# Patient Record
Sex: Male | Born: 1975 | Race: White | Hispanic: No | Marital: Married | Smoking: Current every day smoker
Health system: Southern US, Community
[De-identification: ages and names within clinical notes are randomized; demographics above are authoritative.]

## PROBLEM LIST (undated history)

## (undated) DIAGNOSIS — K449 Diaphragmatic hernia without obstruction or gangrene: Secondary | ICD-10-CM

## (undated) DIAGNOSIS — K2270 Barrett's esophagus without dysplasia: Secondary | ICD-10-CM

## (undated) DIAGNOSIS — Z8619 Personal history of other infectious and parasitic diseases: Secondary | ICD-10-CM

## (undated) DIAGNOSIS — K649 Unspecified hemorrhoids: Secondary | ICD-10-CM

## (undated) DIAGNOSIS — K219 Gastro-esophageal reflux disease without esophagitis: Secondary | ICD-10-CM

## (undated) DIAGNOSIS — M779 Enthesopathy, unspecified: Secondary | ICD-10-CM

## (undated) DIAGNOSIS — M7030 Other bursitis of elbow, unspecified elbow: Secondary | ICD-10-CM

## (undated) HISTORY — PX: HAND SURGERY: SHX662

## (undated) HISTORY — DX: Personal history of other infectious and parasitic diseases: Z86.19

## (undated) HISTORY — PX: HEMORRHOID SURGERY: SHX153

## (undated) HISTORY — DX: Unspecified hemorrhoids: K64.9

## (undated) HISTORY — PX: OTHER SURGICAL HISTORY: SHX169

## (undated) HISTORY — DX: Barrett's esophagus without dysplasia: K22.70

## (undated) HISTORY — DX: Diaphragmatic hernia without obstruction or gangrene: K44.9

## (undated) HISTORY — PX: SHOULDER SURGERY: SHX246

---

## 1999-09-22 ENCOUNTER — Emergency Department (HOSPITAL_COMMUNITY): Admission: EM | Admit: 1999-09-22 | Discharge: 1999-09-22 | Payer: Self-pay | Admitting: Emergency Medicine

## 2000-01-09 ENCOUNTER — Emergency Department (HOSPITAL_COMMUNITY): Admission: EM | Admit: 2000-01-09 | Discharge: 2000-01-09 | Payer: Self-pay | Admitting: Emergency Medicine

## 2003-02-13 ENCOUNTER — Emergency Department (HOSPITAL_COMMUNITY): Admission: EM | Admit: 2003-02-13 | Discharge: 2003-02-13 | Payer: Self-pay | Admitting: Emergency Medicine

## 2003-02-13 ENCOUNTER — Encounter: Payer: Self-pay | Admitting: Emergency Medicine

## 2007-06-28 ENCOUNTER — Emergency Department (HOSPITAL_COMMUNITY): Admission: EM | Admit: 2007-06-28 | Discharge: 2007-06-28 | Payer: Self-pay | Admitting: Emergency Medicine

## 2007-07-01 ENCOUNTER — Emergency Department (HOSPITAL_COMMUNITY): Admission: EM | Admit: 2007-07-01 | Discharge: 2007-07-01 | Payer: Self-pay | Admitting: Emergency Medicine

## 2007-07-02 ENCOUNTER — Ambulatory Visit: Payer: Self-pay | Admitting: Internal Medicine

## 2007-07-09 ENCOUNTER — Ambulatory Visit (HOSPITAL_COMMUNITY): Admission: RE | Admit: 2007-07-09 | Discharge: 2007-07-09 | Payer: Self-pay | Admitting: Internal Medicine

## 2007-07-09 ENCOUNTER — Ambulatory Visit: Payer: Self-pay | Admitting: Internal Medicine

## 2007-07-09 HISTORY — PX: COLONOSCOPY: SHX174

## 2007-12-27 ENCOUNTER — Ambulatory Visit (HOSPITAL_COMMUNITY): Admission: RE | Admit: 2007-12-27 | Discharge: 2007-12-27 | Payer: Self-pay | Admitting: Family Medicine

## 2008-01-11 ENCOUNTER — Ambulatory Visit (HOSPITAL_COMMUNITY): Admission: RE | Admit: 2008-01-11 | Discharge: 2008-01-11 | Payer: Self-pay | Admitting: Family Medicine

## 2008-02-07 ENCOUNTER — Ambulatory Visit (HOSPITAL_COMMUNITY): Admission: RE | Admit: 2008-02-07 | Discharge: 2008-02-07 | Payer: Self-pay | Admitting: Orthopaedic Surgery

## 2008-07-16 ENCOUNTER — Emergency Department (HOSPITAL_COMMUNITY): Admission: EM | Admit: 2008-07-16 | Discharge: 2008-07-16 | Payer: Self-pay | Admitting: Emergency Medicine

## 2008-11-07 ENCOUNTER — Ambulatory Visit (HOSPITAL_COMMUNITY): Admission: RE | Admit: 2008-11-07 | Discharge: 2008-11-07 | Payer: Self-pay | Admitting: Orthopaedic Surgery

## 2009-06-03 ENCOUNTER — Ambulatory Visit (HOSPITAL_COMMUNITY): Admission: RE | Admit: 2009-06-03 | Discharge: 2009-06-03 | Payer: Self-pay | Admitting: Orthopaedic Surgery

## 2009-12-23 ENCOUNTER — Emergency Department (HOSPITAL_COMMUNITY): Admission: EM | Admit: 2009-12-23 | Discharge: 2009-12-23 | Payer: Self-pay | Admitting: Emergency Medicine

## 2010-04-28 ENCOUNTER — Ambulatory Visit (HOSPITAL_COMMUNITY): Admission: RE | Admit: 2010-04-28 | Discharge: 2010-04-28 | Payer: Self-pay | Admitting: Orthopaedic Surgery

## 2010-07-26 ENCOUNTER — Emergency Department (HOSPITAL_COMMUNITY): Admission: EM | Admit: 2010-07-26 | Discharge: 2010-07-26 | Payer: Self-pay | Admitting: Emergency Medicine

## 2010-08-05 ENCOUNTER — Emergency Department (HOSPITAL_COMMUNITY): Admission: EM | Admit: 2010-08-05 | Discharge: 2010-08-05 | Payer: Self-pay | Admitting: Emergency Medicine

## 2010-10-31 ENCOUNTER — Encounter: Payer: Self-pay | Admitting: Family Medicine

## 2010-12-21 ENCOUNTER — Ambulatory Visit (INDEPENDENT_AMBULATORY_CARE_PROVIDER_SITE_OTHER): Payer: Federal, State, Local not specified - PPO | Admitting: Gastroenterology

## 2010-12-21 ENCOUNTER — Encounter: Payer: Self-pay | Admitting: Gastroenterology

## 2010-12-21 ENCOUNTER — Encounter: Payer: Self-pay | Admitting: Internal Medicine

## 2010-12-21 DIAGNOSIS — R1013 Epigastric pain: Secondary | ICD-10-CM

## 2010-12-21 DIAGNOSIS — K920 Hematemesis: Secondary | ICD-10-CM

## 2010-12-22 LAB — DIFFERENTIAL
Basophils Relative: 0 % (ref 0–1)
Eosinophils Absolute: 0.2 10*3/uL (ref 0.0–0.7)
Lymphocytes Relative: 21 % (ref 12–46)
Lymphs Abs: 2.6 10*3/uL (ref 0.7–4.0)
Neutrophils Relative %: 71 % (ref 43–77)

## 2010-12-22 LAB — CBC
Platelets: 263 10*3/uL (ref 150–400)
RBC: 5.16 MIL/uL (ref 4.22–5.81)
RDW: 12.6 % (ref 11.5–15.5)
WBC: 12.2 10*3/uL — ABNORMAL HIGH (ref 4.0–10.5)

## 2010-12-22 LAB — BASIC METABOLIC PANEL
BUN: 11 mg/dL (ref 6–23)
Calcium: 9.4 mg/dL (ref 8.4–10.5)
Creatinine, Ser: 1 mg/dL (ref 0.4–1.5)
GFR calc Af Amer: >60 mL/min (ref >60)
GFR calc non Af Amer: >60 mL/min (ref >60)

## 2010-12-23 ENCOUNTER — Other Ambulatory Visit: Payer: Self-pay | Admitting: Internal Medicine

## 2010-12-23 ENCOUNTER — Encounter: Payer: Federal, State, Local not specified - PPO | Admitting: Internal Medicine

## 2010-12-23 ENCOUNTER — Ambulatory Visit (HOSPITAL_COMMUNITY)
Admission: RE | Admit: 2010-12-23 | Discharge: 2010-12-23 | Disposition: A | Discharge status: Home / Self Care | Payer: Federal, State, Local not specified - PPO | Source: Ambulatory Visit | Attending: Internal Medicine | Admitting: Internal Medicine

## 2010-12-23 DIAGNOSIS — K2270 Barrett's esophagus without dysplasia: Secondary | ICD-10-CM

## 2010-12-23 DIAGNOSIS — K920 Hematemesis: Secondary | ICD-10-CM

## 2010-12-23 DIAGNOSIS — R112 Nausea with vomiting, unspecified: Secondary | ICD-10-CM

## 2010-12-23 DIAGNOSIS — R1013 Epigastric pain: Secondary | ICD-10-CM | POA: Insufficient documentation

## 2010-12-23 HISTORY — PX: ESOPHAGOGASTRODUODENOSCOPY: SHX1529

## 2010-12-24 LAB — CONVERTED CEMR LAB
ALT: 19 units/L (ref 0–53)
Albumin: 4.8 g/dL (ref 3.5–5.2)
Basophils Relative: 0 % (ref 0–1)
Bilirubin, Direct: 0.1 mg/dL (ref 0.0–0.3)
Eosinophils Absolute: 0.2 10*3/uL (ref 0.0–0.7)
Lipase: 19 units/L (ref 0–75)
MCHC: 33.4 g/dL (ref 30.0–36.0)
MCV: 93.1 fL (ref 78.0–100.0)
Monocytes Relative: 6 % (ref 3–12)
Neutrophils Relative %: 52 % (ref 43–77)
RBC: 5.18 M/uL (ref 4.22–5.81)
Total Protein: 7 g/dL (ref 6.0–8.3)

## 2010-12-26 ENCOUNTER — Encounter: Payer: Self-pay | Admitting: Internal Medicine

## 2010-12-26 NOTE — Op Note (Signed)
  NAME:  [name], [name]                 ACCOUNT NO.:  [id number]  MEDICAL RECORD NO.:  [id number]           PATIENT TYPE:  O  LOCATION:  DAYP                          FACILITY:  [facility]  PHYSICIAN:  R. Roetta Sessions, M.D. DATE OF BIRTH:  1976-05-30  DATE OF PROCEDURE:  12/23/2010 DATE OF DISCHARGE:                              OPERATIVE REPORT   PROCEDURE:  Esophagogastroduodenoscopy with biopsy.  SURGEON:  R. Roetta Sessions, MD  INDICATIONS FOR PROCEDURE:  This is a 35 year old gentleman with recent nausea, vomiting, and hematemesis.  Hemoglobin remains normal.  He has been smoking marijuana more recently, smoking to alleviate symptoms of nausea.  No dysphagia or odynophagia.  He feels omeprazole makes the symptoms worse.  Lipase and LFTs along with CBC came back completely normal recently through our office.  EGD is now being done to further evaluate his symptoms.  Risks, benefits, limitations, alternatives, and plan have been discussed.  Questions answered.  Please see documentation medical record.  PROCEDURE NOTE:  O2 saturation, blood pressure, pulse, respirations were monitored throughout the entire procedure.  CONSCIOUS SEDATION:  Versed 5 mg IV and Demerol 100 mg IV in divided doses.  Phenergan 25 mg dilute slow IV push to augment conscious sedation.  Cetacaine spray for topical pharyngeal anesthesia.  INSTRUMENT:  Pentax video chip system.  FINDINGS:  Examination of tubular esophagus revealed a prominent 3-cm tongue of salmon-colored epithelium coming up above the GE junction. There were 2 little skinny "ribbons" of salmon-colored epithelium coming up on the opposite wall.  There was no stricture, esophagitis, nodes, or neoplasia.  EG junction was easily traversed.  Stomach:  Gas cavity was emptied and insufflated well with air.  Thorough examination of the gastric mucosa including retroflexion view of the proximal esophagogastric stomach esophagogastric junction  demonstrated only a tiny hiatal hernia.  Pylorus was patent and easily traversed. Examination of the bulb and second portion revealed no abnormalities. Therapeutic/diagnostic maneuvers were performed.  Biopsies of the salmon- colored epithelium coming up into the distal esophageal biopsy for a histologic study.  The patient tolerated the procedure well.  IMPRESSION:  "Tongues" of salmon-colored epithelium, distal esophagus, suspicious for at least short segment Barret esophagus status post biopsy, otherwise normal-appearing tubular sulcus and very small hiatal hernia, otherwise normal stomach.  RECOMMENDATIONS: 1. Stop illicit drug use, marijuana use.  May actually be     complicating/compounding and even contributing to his symptoms of     nausea. 2. Stop omeprazole, begin Dexalone 60 mg orally daily (the patient is     to go to my office to get a 2 weeks free     samples). 3. Prescription for Zofran ODT tablets 1 before meals t.i.d. p.r.n.     nausea. 4. Further recommendations to follow.     Jonathon Bellows, M.D.     RMR/MEDQ  D:  12/23/2010  T:  12/23/2010  Job:  [id number]  cc:   Laverle Hobby, M.D. [address] [place], [place] [address]  Electronically Signed by Lorrin Goodell M.D. on 12/26/2010 09:13:09 AM

## 2010-12-28 ENCOUNTER — Ambulatory Visit: Payer: Self-pay | Admitting: Gastroenterology

## 2010-12-28 NOTE — Letter (Signed)
Summary: EGD ORDER  EGD ORDER   Imported By: Ave Filter 12/21/2010 11:44:58  _____________________________________________________________________  External Attachment:    Type:   Image     Comment:   External Document

## 2010-12-28 NOTE — Assessment & Plan Note (Signed)
Summary: pud,nausea,vomiting   Vital Signs:  Patient profile:   35 year old male Height:      68 inches Weight:      182 pounds BMI:     27.77 Temp:     98.9 degrees F oral Pulse rate:   64 / minute BP sitting:   110 / 82  (left arm)  Vitals Entered By: Carolan Clines LPN (December 21, 2010 10:24 AM)  Visit Type:  consult Referring Provider:  Laverle Hobby  Chief Complaint:  n/v and hematemesis.  History of Present Illness: [name] [name] is a pleasant 35 y/o WM, who was sent by Dr. Wende Crease for further evaluation of n/v, hematemesis. Patient states he woke up last Thursday with N/V. Saw hematemesis with first episode of vomiting. Several months of nausea. Hgb normal. No melena, brbpr, constipation, diarrhea, LUQ pain. LUQ pain worse with meals. Omeprazole started last week but n/v no better. Four days per week drinks 24oz beer. No heartburn. Lost five pounds. No NSAIDS/ASA. Admits to marijuana use every other day. Denies urge to take hot showers for relief of n/v.   Current Medications (verified): 1)  Omeprazole 40 Mg Cpdr (Omeprazole) .... Take 30-58mins Before Meal  Allergies (verified): No Known Drug Allergies  Past History:  Past Medical History: He has a history of esophagitis/GERD with the last EGD by [facility] in [place] in remote past.  TCS/TI, 9/08, Dr. Audelia Hives anal canal, occult fissure not excluded.  Otherwise, normal rectum, colon, and terminal ileum.   Past Surgical History: Shoulder surgery, 1999 Hemorrhoid surgery.  Family History: No FH of liver disease, CRC. Father with "lots of problems".  Social History: Married for 6 years.  He has an 20 year-old daughter who is healthy. He has 2 stepchildren.  He is a Nutritional therapist.  He has a 24-pack-year history of tobacco use.  1/2 pack per day. He consumes four 24oz beers per week.  Smokes marijuana every other day.  Review of Systems General:  Denies fever, chills, sweats, anorexia, fatigue, weakness, and weight  loss. Eyes:  Denies vision loss. ENT:  Denies nasal congestion, hoarseness, and difficulty swallowing. CV:  Denies chest pains, angina, palpitations, dyspnea on exertion, and peripheral edema. Resp:  Denies dyspnea at rest, dyspnea with exercise, cough, sputum, and wheezing. GI:  See HPI. GU:  Denies urinary burning and blood in urine. MS:  Denies joint pain / LOM and low back pain. Derm:  Denies rash and itching. Neuro:  Denies weakness, frequent headaches, memory loss, and confusion. Psych:  Denies depression and anxiety. Endo:  Denies unusual weight change. Heme:  Denies bruising and bleeding. Allergy:  Denies hives and rash.  Physical Exam  General:  Well developed, well nourished, no acute distress. Head:  Normocephalic and atraumatic. Eyes:  Conjunctivae pink, no scleral icterus.  Mouth:  Oropharyngeal mucosa moist, pink.  No lesions, erythema or exudate.    Neck:  Supple; no masses or thyromegaly. Lungs:  Clear throughout to auscultation. Heart:  Regular rate and rhythm; no murmurs, rubs,  or bruits. Abdomen:  Moderate epig tenderness. No rebound or guarding. No HSM or masses, no abd bruit or hernia. Extremities:  No clubbing, cyanosis, edema or deformities noted. Neurologic:  Alert and  oriented x4;  grossly normal neurologically. Skin:  Intact without significant lesions or rashes. Cervical Nodes:  No significant cervical adenopathy. Psych:  Alert and cooperative. Normal mood and affect.   Impression & Recommendations:  Problem # 1:  EPIGASTRIC PAIN (ICD-789.06) Epigastric pain  associated with chronic nausea, recent vomiting with hematemesis. Cannot r/o cyclical vomiting related to marijuana use. Given hematemesis will schedule EGD. EGD to be performed in near future.  Risks, alternatives, benefits including but not limited to risk of reaction to medications, bleeding, infection, and perforation addressed.  Patient voiced understanding and verbal consent obtained. Check  labs. R/O pancreatitis as well. Recommended cessation of marijuana use. Start Nexium 40mg  by mouth daily, samples #10 and RX provided. Zofran RX.  Orders: T-CBC w/Diff [id number]) T-Lipase [phone]) T-Hepatic Function [phone]) Consultation Level III (62952) Prescriptions: ZOFRAN 4 MG TABS (ONDANSETRON HCL) one by mouth every 4-6 hours as needed n/v  #20 x 0   Entered and Authorized by:   Leanna Battles. Dixon Boos   Signed by:   Leanna Battles Dixon Boos on 12/21/2010   Method used:   Electronically to        [facility] (retail)       [address]       [place], [place]  [address]       Ph: [phone]       Fax: [phone]   RxID:   [id number] NEXIUM 40 MG CPDR (ESOMEPRAZOLE MAGNESIUM) one by mouth 30 mins before breakfast daily  #30 x 11   Entered and Authorized by:   Leanna Battles. Dixon Boos   Signed by:   Leanna Battles Dixon Boos on 12/21/2010   Method used:   Electronically to        [facility] (retail)       [address]       [place], [place]  [address]       Ph: [phone]       Fax: [phone]   RxID:   [id number]    Orders Added: 1)  T-CBC w/Diff [[id number]] 2)  T-Lipase [phone] 3)  T-Hepatic Function [[id number]] 4)  Consultation Level III [81829]   I would like to thank Dr. Wende Crease for allowing [place] to take part in the care of this nice patient.

## 2011-01-06 NOTE — Letter (Signed)
Summary: Patient Notice, Endo Biopsy Results  [facility]  [address]   [place], [place] [address]   Phone: [phone]  Fax: [phone]       December 26, 2010   [name] [name] [address] [place], [place]  [address] 06-Aug-1976    Dear [name] [name],  I am pleased to inform you that the biopsies taken during your recent endoscopic examination did not show any evidence of cancer upon pathologic examination.  You do have a condition called Barrett's esophagus which is related to long-standing acid reflux.  Additional information/recommendations:  Continue with the treatment plan as outlined on the day of your exam.  You should have a repeat endoscopic examination in 1 year.  Please call us if you are having persistent problems or have questions about your condition that have not been fully answered at this time.  Sincerely,    R. Roetta Sessions MD, Caleen Essex  [facility] Ph: [phone]   Fax: [phone]   Appended Document: Patient Notice, Endo Biopsy Results letter mailed to pt  Appended Document: Patient Notice, Endo Biopsy Results CC TO PCP  Appended Document: Patient Notice, Endo Biopsy Results reminder in epic

## 2011-02-22 NOTE — Consult Note (Signed)
NAME:  [name] [name], [name]                 ACCOUNT NO.:  [id number]   MEDICAL RECORD NO.:  [id number]          PATIENT TYPE:  AMB   LOCATION:  DAY                           FACILITY:  [facility]   PHYSICIAN:  R. Roetta Sessions, M.D. DATE OF BIRTH:  02-25-1976   DATE OF CONSULTATION:  DATE OF DISCHARGE:                                 CONSULTATION   REFERRING PHYSICIAN:  Dr. Rosalia [name] [name].   REASON FOR CONSULTATION:  Rectal bleeding.   HISTORY OF PRESENT ILLNESS:  [name] [name] [name] is a 35 year old, Caucasian male  who presented to the emergency room on June 28, 2007. Around 9:00  a.m., he developed a large amount of burgundy blood with a small amount  of stooling. He had a second episode around 9:30. He denies any clots.  He denies any history of diarrhea or constipation. Denied any abdominal  pain. He did have some proctalgia but denies any pruritus.  He denies  any NSAID or aspirin use.  He denies any heartburn or indigestion and is  maintained on Prilosec 20 mg daily.  Denies any anorexia or early  satiety.  He has lost about 20 pounds in the last couple of months but  his wife states he has increased his physical activity through work  recently.   [name] [name] [name] had a CBC on June 28, 2007 when he was seen in emergency  room and he was shown to have a hemoglobin of 16.1, hematocrit 45.7, and  platelet count of 252, white blood cell count of 8.1.  He had a CT of  the abdomen and pelvis the same date which was normal with contrast.  He  had a normal CMP and a normal urinalysis.   PAST MEDICAL AND SURGICAL HISTORY:  He had an EGD about 5 years ago in  [place]. He said he was told he had esophagitis/chronic GERD, right  shoulder surgery in 1999.   CURRENT MEDICATIONS:  Prilosec 20 mg daily.   ALLERGIES:  No known drug allergies.   FAMILY HISTORY:  No known family history of colorectal carcinoma, liver  or chronic GI problems.  However, the patient does not know family  medical history very well.  He  does know that his father has diabetes  and hypertension.   SOCIAL HISTORY:  [name] [name] [name] is married.  He has two stepchildren as well  as an 60-month-old son.  He is a Nutritional therapist.  He has 20 pack-year history  of tobacco use.  He drinks about a six-pack of beer per week and denies  any drug use.   REVIEW OF SYSTEMS:  See HPI.  Otherwise negative.   PHYSICAL EXAM:  VITAL SIGNS:  Weight 179 pounds, height 70 inches,  temperature 98 degrees, blood pressure 120/80, and pulse 58.  GENERAL:  [name] [name] [name] is a well-developed, well-nourished, Caucasian male  in no acute distress.  HEENT.  Sclera clear. Nonicteric.  Conjunction pink.  Oropharynx pink  and moist without lesions.  NECK:  Supple without mass or thyromegaly.  CHEST:  Heart regular rate and rhythm. Normal S1, S2 without murmur,  thrills, rubs or  gallops.  LUNGS:  Clear to auscultation bilaterally.  ABDOMEN:  Positive bowel sounds x4.  No bruits auscultated.  Soft,  nontender, nondistended, without palpable mass or hepatosplenomegaly.  No rebound tenderness or guarding.  EXTREMITIES:  Without clubbing or edema bilaterally.  SKIN:  Pink, warm, and dry without any rash or jaundice.  RECTAL:  No external lesions visualized. Good sphincter tone, no  internal masses palpated. A small amount of clear secretions and scant  brown stool was obtained from the vault which was Hemoccult negative.   IMPRESSION:  [name] [name] [name] is a 35 year old Caucasian male with 2 episodes  of moderate to large volume burgundy blood in his stool last week. He is  going to require further evaluation to rule out diverticular bleeding,  polyps or colorectal carcinoma, ischemia should remain in the  differential as well.   PLAN:  Colonoscopy with Dr. Jena Gauss in the near future. I discussed the  procedure including risks and benefits which include but are not limited  to bleeding, infection, perforation, drug reaction, he agrees and  informed consent will be  obtained.      Lorenza Burton, N.P.      Jonathon Bellows, M.D.  Electronically Signed    KJ/MEDQ  D:  07/02/2007  T:  07/03/2007  Job:  [id number]

## 2011-02-22 NOTE — Consult Note (Signed)
NAME:  [name], [name]                 ACCOUNT NO.:  [id number]   MEDICAL RECORD NO.:  [id number]          PATIENT TYPE:  AMB   LOCATION:  DAY                           FACILITY:  [facility]   PHYSICIAN:  R. Roetta Sessions, M.D. DATE OF BIRTH:  Mar 03, 1976   DATE OF CONSULTATION:  DATE OF DISCHARGE:                                 CONSULTATION   REQUESTING PHYSICIAN:  Dr. Rosalia Hammers.   REASON FOR CONSULTATION:  Rectal bleeding.   HISTORY OF PRESENT ILLNESS:  [name] [name] is 35 year old Caucasian male who  on June 28, 2007 developed moderate to large volume burgundy bloody  stools.  He had one about 9:00 a.m. and other about 9:30.  He denies any  clots.  He denies any abdominal pain.  He did have some proctalgia.  Denies any diarrhea or constipation.  Denies any rectal pruritus.  Denies any NSAID or aspirin use.  Denies any heartburn, indigestion,  dysphagia or odynophagia.  Denies any anorexia or early satiety.  He has  lost about 20 pounds in the last couple of months, but his wife  attributes this to increased physical activity at work.  While  hospitalized, he had a normal CBC, normal CMP, normal urinalysis and  normal CT scan of the abdomen and pelvis with IV and oral contrast.   PAST MEDICAL AND SURGICAL HISTORY:  He has a history of esophagitis/GERD  with the last EGD by [facility] in [place] approximately 5 years ago per  patient report.  He has history of right shoulder surgery in 1999.   CURRENT MEDICATIONS:  Prilosec 20 mg daily.   ALLERGIES:  NO KNOWN DRUG ALLERGIES.   FAMILY HISTORY:  There is no known family history of colorectal  carcinoma, liver or chronic GI problems as far as the patient knows.  He  does not have a good assessment of his family history but does note his  father has hypertension and diabetes.   SOCIAL HISTORY:  [name] [name] has been married for 3 years.  He has an 48-  month-old daughter who is healthy.  He has 2 stepchildren.  He is a  Nutritional therapist.  He has a  20-pack-year history of tobacco use.  He consumes  about six beers per week.  Denies any drug use.   REVIEW OF SYSTEMS:  See HPI.  Otherwise negative.   PHYSICAL EXAMINATION:  VITAL SIGNS:  Weight 179 pounds, height 70  inches, temperature 98 degrees, blood pressure 120/80, pulse 58.  GENERAL:  [name] [name] is a well-developed, well-nourished Caucasian male  in no acute distress accompanied by his wife today.  HEENT:  Sclerae clear, nonicteric.  Conjunctivae pink.  Oropharynx is  moist without lesions.  NECK:  Supple without any masses or thyromegaly.  CHEST:  Heart regular rhythm, normal S1 and S2 without murmurs, rubs,  clicks, rubs or gallop.  LUNGS:  Clear to auscultation bilaterally.  ABDOMEN:  Positive bowel sounds x4.  No bruits auscultated.  Soft,  nontender and nondistended without palpable mass or hepatosplenomegaly.  No rebound tenderness or guarding.  RECTAL:  No external lesions visualized.  Good sphincter tone noted.  No  internal masses palpated.  Small amount of clear secretions and scant  brown stool obtained for the vault which was Hemoccult negative.  EXTREMITIES:  Without clubbing or edema bilaterally.   IMPRESSION:  [name] [name] is a 35-year Caucasian male with a self-limited  episode of moderate to large volume rectal bleeding.  He is going to  require further evaluation to rule out diverticular bleeding, ischemia,  polyps, or colorectal carcinoma.   PLAN:  1. Colonoscopy with Dr. Jena Gauss in the near future.  I have discussed      the procedure including risks and benefits which include but are      not limited to bleeding, infection, perforation, drug reaction.  He      agrees with the plan and consent will be obtained.  2. Further recommendations pending procedure.      Lorenza Burton, N.P.      Jonathon Bellows, M.D.  Electronically Signed    KJ/MEDQ  D:  07/02/2007  T:  07/03/2007  Job:  [id number]

## 2011-02-22 NOTE — Op Note (Signed)
NAME:  [name] [name], [name] [name]                 ACCOUNT NO.:  [id number]   MEDICAL RECORD NO.:  [id number]          PATIENT TYPE:  AMB   LOCATION:  DAY                           FACILITY:  [facility]   PHYSICIAN:  J. Darreld Mclean, M.D. DATE OF BIRTH:  Jul 17, 1976   DATE OF PROCEDURE:  DATE OF DISCHARGE:                               OPERATIVE REPORT   PREOPERATIVE DIAGNOSES:  Status post injury to right long extensor  tendon at the metacarpophalangeal joint dorsally with granuloma and  possible tendon injury.   POSTOPERATIVE DIAGNOSES:  Status post injury to right long extensor  tendon at the metacarpophalangeal joint area with granuloma and partial  tear of the extensor tendon.   PROCEDURE:  1. Exploration of old injury to right long finger at the MCP joint      area with excision of granuloma and granulomatous material.  2. Repair of the partial tear of extensor tendon, right long finger.   ANESTHESIA:  Bier block.  Please see anesthesia record for tourniquet  time.   No drains were used.  A dorsal aluminum splint was used.   SURGEON:  J. Darreld Mclean, MD.   SPECIMENS:  Sent to pathology, granulomatous material.   INDICATIONS:  The patient is a 35 year old male who cut his right  dominant hand over the MCP joint area to the right long extensor tendon  in mid February of this year.  He had some pain and tenderness  approximately 3-4 weeks later.  He was seen by his family physician and  given antibiotics.  The area has developed a knot and tenderness and  has bothered him at his work.  Due to his work schedule, he was unable  to have anything done to this surgically until now.  I told him he  probably had a partial tear with granulomatous material.  He understands  he will be in a splint postoperatively for 10 days to 3 weeks depending  on what is found and he will need a pain medicine.  This is an  outpatient procedure.  He appears to understand the risks and  imponderables.  He  understands that due to his type of work if he hits  it or bangs it, then some granulomatous tissue may reappear.   DESCRIPTION OF PROCEDURE:  The patient was seen in the holding area, the  correct surgical site was marked over the right long extensor tendon, he  placed a mark, and he was brought back to the operating room area.  He  was given Bier block anesthesia while supine on the operating room table  with hand table attached.  He was prepped and draped in the usual  manner.  A time-out identifying Mr. Tagliaferri as the patient and the right  long extensor tendon of the MCP joint area as the correct site of  surgery.  Incision was made just proximal to the granulomatous swelling  and brought proximally.  The extensor tendon had some slight changes.  I  extended the wound, and I could see where there had been a partial tear  of the extensor  tendon with considerable amount of granulomatous  material.  Granulomatous material was removed sharply and then on the  skin  where there was a large amount of granulomatous tissue, this was  excised as well including the skin over it.  There was no drainage  tract.  After removing most of the granulomatous tissue, I could see  there was a partial tear of the extensor tendon.  It was a V-shape type  tear.  The tendon was more intact on the radial side and on the little  finger over the lateral side/ulnar side, that is where the tear was.  I  freed up this area, roughed it up so to speak, and then closed this  using 4-0 Prolene suture.  Nice repair was obtained.  Excess  granulomatous tissue was removed.  Passively, I was able to move his  fingers, and they worked well.  The skin wound was closed using 3-0  nylon in a vertical mattress fashion.  Sterile dressing was applied,  bulky dressing applied, Ace bandage applied, and then a dorsal aluminum  splint applied to the long finger.  The patient tolerated the procedure  well, went to recovery in good  condition.  Prescription of Percocet  given for pain.  I will see him in the office in approximately 10 days  to 2 weeks.  If any difficulties, he is to contact me through the office  or hospital beeper system.  Numbers have been provided.           ______________________________  Shela Commons. Darreld Mclean, M.D.     JWK/MEDQ  D:  02/07/2008  T:  02/07/2008  Job:  [id number]

## 2011-02-22 NOTE — Op Note (Signed)
NAME:  [name] [name], [name] [name]                 ACCOUNT NO.:  [id number]   MEDICAL RECORD NO.:  [id number]          PATIENT TYPE:  AMB   LOCATION:  DAY                           FACILITY:  [facility]   PHYSICIAN:  R. Roetta Sessions, M.D. DATE OF BIRTH:  06-01-1976   DATE OF PROCEDURE:  DATE OF DISCHARGE:                               OPERATIVE REPORT   PROCEDURE:  Ileocolonoscopy diagnostic.   INDICATIONS FOR PROCEDURE:  A couple of episodes of painless  hematochezia.  He was seen in the ED on September 18th by Dr. Margarita Grizzle.  At that point in time, he was found to be hemodynamically stable  and had an H&H of 16.1 and 45.7.  He has not had any upper GI tract  symptoms besides reflux well-controlled on Prilosec 20 mg orally daily.  He has not had any melena, no abdominal pain, no family history of GI  malignancy.  Colonoscopy is now being done.  This approach has been  discussed at length with the patient, the potential risks, benefits, and  alternatives have been reviewed, questions answered, he is agreeable.  Please see the documentation in the medical record.   PROCEDURE NOTE:  O2 saturation, blood pressure, pulse, and respirations  were monitored throughout the entire procedure.  Conscious sedation with  Versed 3 mg IV, Demerol 75 mg IV in divided doses.   INSTRUMENT:  Pentax video chip system.   FINDINGS:  Digital rectal exam revealed no abnormalities.   ENDOSCOPIC FINDINGS:  The prep was excellent:  Colon:  The colonic  mucosa was surveyed from the rectosigmoid junction to the left tranverse  and right colon to the appendiceal orifices, ileocecal valve, and the  cecum.  These structures were well-seen and photographed for the record.  The terminal ileum was then invaded 10 ml.  From this level, the scope  was slowly withdrawn.  All previous colonic mucosal surfaces were again  seen.  The colonic mucosa appeared entirely normal as did the terminal  ileal mucosa.  The scope was pulled down  into the rectum __________ the  rectal mucosa and complete retroflexed view of the anal verge.  An on-  phos view of the anal canal demonstrated friable anal canal, no discrete  hemorrhoids, fissure, or other abnormality was observed.  The patient  tolerated the procedure well and was reacted in Endoscopy.   IMPRESSION:  Friable anal canal, occult fissure not excluded.  Otherwise, normal rectum, colon, and terminal ileum.   I suspect the patient has experienced rectal bleeding from a benign  anorectal source.   RECOMMENDATIONS:  1. Cut back on weekly consumption of beer.  2. Anusol-HC suppositories one per rectum at bedtime x10 days.  3. [name] is to let me know if he has any recurrent bleeding.      Jonathon Bellows, M.D.  Electronically Signed     RMR/MEDQ  D:  07/09/2007  T:  07/09/2007  Job:  [id number]   cc:   Hilario Quarry, M.D.  Fax: [phone]

## 2011-02-22 NOTE — H&P (Signed)
NAME:  [name] [name], [name] [name]                 ACCOUNT NO.:  [id number]   MEDICAL RECORD NO.:  [id number]          PATIENT TYPE:  AMB   LOCATION:  DAY                           FACILITY:  [facility]   PHYSICIAN:  J. Darreld Mclean, M.D. DATE OF BIRTH:  1976/07/21   DATE OF ADMISSION:  DATE OF DISCHARGE:  LH                              HISTORY & PHYSICAL   CHIEF COMPLAINT:  I've got a knot on my right hand.   I first saw the patient in the office on March 20.  He is 35 years old.  He had been seen in [place] when he cut his right third metacarpal joint  area on a galvanized nail in mid February of this year.  He developed  swelling and tenderness over the joint.  He has a large prominence  there.  I recommend exploration of wound.  I think he may have a partial  tear of extensor tendon which is healed with exorbitant granulation.  He  was doing a job out of town that he said he could not miss, that he  could not be out of work due to the bad economy and that he had taken  some time to get the job which is paying good money.  He had to work at  least until the end of April before he could take any time at all.  I  told him to just watch the hand. If it got worse, to let me know.  We  scheduled him for exploration of the old wound the end of April. He  agreed to this, and I agree to this.  It should be noted that x-rays  were negative.  He had doxycycline at the time of the injury, and that  resolved any inflammation.  I saw him in the office the day before  surgery, April 29, and the extensor tendon appears to be intact, though  it is slightly weak and has a very large prominence over the  metacarpophalangeal joint dorsally of the extensor tendon here.  It is  tender and swollen.  It is not red.  Neurovascular is intact.   PAST MEDICAL HISTORY:  Negative.  He denies heart disease, lung disease,  kidney disease, stroke, paralysis, weakness, hypertension, diabetes, TB,  cancer.  Does have a history of  ulcer disease and denies circulatory  problems.   He has no allergies.  He takes Prilosec daily.   SOCIAL HISTORY:  He lives in [place], and he is married.  He smokes  and uses alcoholic beverages socially.  Dr. Sherwood Gambler is his family doctor.   He is status post rotator cuff repair in [place] in 1999.   PHYSICAL EXAMINATION:  VITAL SIGNS:  He is afebrile, pulse 60,  respiratory rate 16.  He is 5 feet 7-1/2 inches, 180 pounds.  Blood  pressure is 110/80.  GENERAL:  He is alert, cooperative, oriented.  HEENT:  Negative.  NECK:  Supple.  LUNGS:  Clear to percussion and auscultation.  HEART:  Regular without murmur heard.  ABDOMEN:  Soft, tender without masses.  EXTREMITIES:  On the right hand long finger dorsally, there is  prominence over the metacarpophalangeal joint of the extensor  tenderness.  It is tender.  It is not red.  Motion is good. Grip  strengths are good.  Other extremities are negative.  CNS: Intact.  SKIN:  Intact.   PLAN:  Exploration of old wound, right dorsal hand extensor tendon,  either granulomatous tissue or delayed tendon repair. Discussed with  patient planned procedure, risks, and imponderables.  He appears to  understand and agrees to the procedure as stated.                                            ______________________________  J. Darreld Mclean, M.D.     JWK/MEDQ  D:  02/06/2008  T:  02/06/2008  Job:  [id number]

## 2011-07-05 LAB — URINALYSIS, ROUTINE W REFLEX MICROSCOPIC
Glucose, UA: NEGATIVE
Hgb urine dipstick: NEGATIVE
Specific Gravity, Urine: 1.02
Urobilinogen, UA: 0.2

## 2011-07-05 LAB — BASIC METABOLIC PANEL
Calcium: 9.6
Chloride: 107
Creatinine, Ser: 1.16
GFR calc Af Amer: >60
GFR calc non Af Amer: >60

## 2011-07-05 LAB — DIFFERENTIAL
Lymphs Abs: 2.5
Monocytes Relative: 6
Neutro Abs: 3.1
Neutrophils Relative %: 50

## 2011-07-05 LAB — CBC
RBC: 4.71
WBC: 6.2

## 2011-07-21 LAB — COMPREHENSIVE METABOLIC PANEL
ALT: 22
AST: 17
Albumin: 4.1
Alkaline Phosphatase: 49
BUN: 10
Chloride: 107
Potassium: 4.9
Sodium: 140
Total Bilirubin: 0.9
Total Protein: 6.6

## 2011-07-21 LAB — DIFFERENTIAL
Basophils Absolute: 0
Basophils Relative: 1
Lymphocytes Relative: 45
Neutro Abs: 3.7
Neutrophils Relative %: 45

## 2011-07-21 LAB — URINALYSIS, ROUTINE W REFLEX MICROSCOPIC
Glucose, UA: NEGATIVE
Nitrite: NEGATIVE
Protein, ur: NEGATIVE
pH: 6

## 2011-07-21 LAB — CBC
HCT: 45.7
Platelets: 252
RDW: 12.7
WBC: 8.1

## 2012-01-02 ENCOUNTER — Encounter: Payer: Self-pay | Admitting: Gastroenterology

## 2012-01-09 ENCOUNTER — Encounter: Payer: Self-pay | Admitting: Internal Medicine

## 2012-01-10 ENCOUNTER — Ambulatory Visit: Payer: Federal, State, Local not specified - PPO | Admitting: Gastroenterology

## 2012-01-10 ENCOUNTER — Telehealth: Payer: Self-pay | Admitting: Gastroenterology

## 2012-01-10 NOTE — Telephone Encounter (Signed)
Pt was a no show

## 2012-02-09 NOTE — Telephone Encounter (Signed)
First no-show. Needs letter for OV, follow-up

## 2012-02-15 ENCOUNTER — Encounter: Payer: Self-pay | Admitting: Internal Medicine

## 2012-02-15 NOTE — Telephone Encounter (Signed)
Mailed letter to patient to call office to set up OV °

## 2012-04-16 ENCOUNTER — Encounter (HOSPITAL_COMMUNITY): Payer: Self-pay | Admitting: *Deleted

## 2012-04-16 ENCOUNTER — Emergency Department (HOSPITAL_COMMUNITY)
Admission: EM | Admit: 2012-04-16 | Discharge: 2012-04-16 | Disposition: A | Discharge status: Home / Self Care | Payer: Federal, State, Local not specified - PPO | Attending: Emergency Medicine | Admitting: Emergency Medicine

## 2012-04-16 DIAGNOSIS — K0889 Other specified disorders of teeth and supporting structures: Secondary | ICD-10-CM

## 2012-04-16 DIAGNOSIS — K089 Disorder of teeth and supporting structures, unspecified: Secondary | ICD-10-CM | POA: Insufficient documentation

## 2012-04-16 DIAGNOSIS — F17200 Nicotine dependence, unspecified, uncomplicated: Secondary | ICD-10-CM | POA: Insufficient documentation

## 2012-04-16 MED ORDER — PENICILLIN V POTASSIUM 500 MG PO TABS: 500.0000 mg | ORAL_TABLET | Freq: Four times a day (QID) | ORAL | Status: AC

## 2012-04-16 MED ORDER — PENICILLIN V POTASSIUM 250 MG PO TABS
500.0000 mg | ORAL_TABLET | Freq: Once | ORAL | Status: AC
  Administered 2012-04-16: 500 mg via ORAL
  Filled 2012-04-16: qty 2

## 2012-04-16 MED ORDER — HYDROCODONE-ACETAMINOPHEN 5-325 MG PO TABS
1.0000 | ORAL_TABLET | Freq: Once | ORAL | Status: AC
  Administered 2012-04-16: 1 via ORAL
  Filled 2012-04-16: qty 1

## 2012-04-16 MED ORDER — HYDROCODONE-ACETAMINOPHEN 5-325 MG PO TABS: ORAL_TABLET | ORAL | Status: DC

## 2012-04-16 MED ORDER — HYDROCOD POLST-CHLORPHEN POLST 10-8 MG/5ML PO LQCR: 5.0000 mL | Freq: Once | ORAL | Status: DC

## 2012-04-16 MED ORDER — IBUPROFEN 800 MG PO TABS
800.0000 mg | ORAL_TABLET | Freq: Once | ORAL | Status: AC
  Administered 2012-04-16: 800 mg via ORAL
  Filled 2012-04-16: qty 1

## 2012-04-16 NOTE — ED Notes (Signed)
C/o right side toothache ache, facial pain that started Friday, pt states that he is having pain on lower and upper jaw area, states that he does not have many teeth on the right side in the back area but does have one that needs to be filled.

## 2012-04-16 NOTE — ED Provider Notes (Signed)
Medical screening examination/treatment/procedure(s) were performed by non-physician practitioner and as supervising physician I was immediately available for consultation/collaboration.  Donnetta Hutching, MD 04/16/12 [phone]

## 2012-04-16 NOTE — ED Provider Notes (Signed)
History     CSN: [id number]  Arrival date & time 04/16/12  1421   First MD Initiated Contact with Patient 04/16/12 1453      Chief Complaint  Patient presents with  . Dental Pain    (Consider location/radiation/quality/duration/timing/severity/associated sxs/prior treatment) HPI Comments: Called his dentist and they can see him in 2 days.  Told him to come to the ED.  No fever or chills.  Patient is a 36 y.o. male presenting with tooth pain. The history is provided by the patient. No language interpreter was used.  Dental PainThe primary symptoms include mouth pain. Primary symptoms do not include dental injury or fever. Episode onset: 3 days ago. The symptoms are unchanged. The symptoms occur constantly.  Additional symptoms include: jaw pain. Additional symptoms do not include: gum swelling, purulent gums, facial swelling, ear pain and swollen glands.    History reviewed. No pertinent past medical history.  Past Surgical History  Procedure Date  . Esophagogastroduodenoscopy 12/23/2010    Tongues" of salmon-colored epithelium, distal esophagus,  suspicious for at least short segment Barret esophagus status post biopsy, otherwise normal-appearing tubular sulcus and very small hiatal  hernia, otherwise normal stomach.  . Colonoscopy   07/09/2007    Friable anal canal, occult fissure not excluded  Otherwise, normal rectum, colon, and terminal ileum  . Shoulder surgery   . Hand surgery   . Hemorrhoid surgery     No family history on file.  History  Substance Use Topics  . Smoking status: Current Everyday Smoker  . Smokeless tobacco: Not on file  . Alcohol Use: Yes     occassional       Review of Systems  Constitutional: Negative for fever and chills.  HENT: Positive for dental problem. Negative for ear pain and facial swelling.   All other systems reviewed and are negative.    Allergies  Review of patient's allergies indicates no known allergies.  Home Medications    Current Outpatient Rx  Name Route Sig Dispense Refill  . HYDROCODONE-ACETAMINOPHEN 5-325 MG PO TABS  One tab po q 4-6 hrs prn pain 20 tablet 0  . PENICILLIN V POTASSIUM 500 MG PO TABS Oral Take 1 tablet (500 mg total) by mouth 4 (four) times daily. 40 tablet 0    BP 132/89  Pulse 79  Temp 98.4 F (36.9 C)  Resp 20  Ht 5\' 7"  (1.702 m)  Wt 183 lb (83.008 kg)  BMI 28.66 kg/m2  SpO2 99%  Physical Exam  Nursing note and vitals reviewed. Constitutional: He is oriented to person, place, and time. He appears well-developed and well-nourished.  HENT:  Head: Normocephalic and atraumatic.  Mouth/Throat: Uvula is midline, oropharynx is clear and moist and mucous membranes are normal. Uvula swelling and dental caries present. No dental abscesses.    Eyes: EOM are normal.  Neck: Normal range of motion.  Cardiovascular: Normal rate, regular rhythm, normal heart sounds and intact distal pulses.   Pulmonary/Chest: Effort normal and breath sounds normal. No respiratory distress.  Abdominal: Soft. He exhibits no distension. There is no tenderness.  Musculoskeletal: Normal range of motion.  Neurological: He is alert and oriented to person, place, and time.  Skin: Skin is warm and dry.  Psychiatric: He has a normal mood and affect. Judgment normal.    ED Course  Procedures (including critical care time)  Labs Reviewed - No data to display No results found.   1. Pain, dental       MDM  No obvious abscess rx-hydrocodone, 20 rx-pen VK 500 mg , 40 OTC ibuprofen        Evalina Field, [place] 04/16/12 1526

## 2012-10-06 ENCOUNTER — Emergency Department (HOSPITAL_COMMUNITY)
Admission: EM | Admit: 2012-10-06 | Discharge: 2012-10-06 | Disposition: A | Discharge status: Home / Self Care | Payer: Federal, State, Local not specified - PPO | Attending: Emergency Medicine | Admitting: Emergency Medicine

## 2012-10-06 ENCOUNTER — Encounter (HOSPITAL_COMMUNITY): Payer: Self-pay

## 2012-10-06 DIAGNOSIS — J029 Acute pharyngitis, unspecified: Secondary | ICD-10-CM | POA: Insufficient documentation

## 2012-10-06 DIAGNOSIS — M7710 Lateral epicondylitis, unspecified elbow: Secondary | ICD-10-CM | POA: Insufficient documentation

## 2012-10-06 DIAGNOSIS — R0989 Other specified symptoms and signs involving the circulatory and respiratory systems: Secondary | ICD-10-CM | POA: Insufficient documentation

## 2012-10-06 DIAGNOSIS — R509 Fever, unspecified: Secondary | ICD-10-CM | POA: Insufficient documentation

## 2012-10-06 DIAGNOSIS — F17200 Nicotine dependence, unspecified, uncomplicated: Secondary | ICD-10-CM | POA: Insufficient documentation

## 2012-10-06 MED ORDER — IBUPROFEN 800 MG PO TABS: 800.0000 mg | ORAL_TABLET | Freq: Three times a day (TID) | ORAL | Status: DC

## 2012-10-06 MED ORDER — MAGIC MOUTHWASH W/LIDOCAINE: ORAL | Status: DC

## 2012-10-06 MED ORDER — HYDROCODONE-ACETAMINOPHEN 7.5-500 MG/15ML PO SOLN: ORAL | Status: DC

## 2012-10-06 NOTE — ED Provider Notes (Signed)
History     CSN: [id number]  Arrival date & time 10/06/12  1549   First MD Initiated Contact with Patient 10/06/12 1733      Chief Complaint  Patient presents with  . Sore Throat    (Consider location/radiation/quality/duration/timing/severity/associated sxs/prior treatment) HPI Comments: Patient complains of sore throat for 2 days. States his wife has recently been diagnosed with strep throat and is currently taking antibiotics. He also reports intermittent fever and chills at home.  He states he has been using over-the-counter throat spray without relief. He denies neck pain, headache, vomiting or abdominal pain. Patient also complains of pain to his right elbow for several days. States pain is worse with movement of his arm and with gripping. He states that he drops things when he tries to lift objects with is right arm.  States he has been diagnosed previously with bursitis, but states this pain feels different.  He denies known injury, numbness or tingling to his fingers or swelling of the arm.  Patient is a 36 y.o. male presenting with pharyngitis. The history is provided by the patient.  Sore Throat This is a new problem. The current episode started in the past 7 days. The problem occurs constantly. The problem has been unchanged. Associated symptoms include arthralgias, chills, congestion, a fever and a sore throat. Pertinent negatives include no abdominal pain, chest pain, coughing, headaches, joint swelling, myalgias, nausea, neck pain, numbness, rash, swollen glands, vomiting or weakness. The symptoms are aggravated by swallowing. He has tried nothing for the symptoms. The treatment provided no relief.    History reviewed. No pertinent past medical history.  Past Surgical History  Procedure Date  . Thumb surgery   . Shoulder surgery     No family history on file.  History  Substance Use Topics  . Smoking status: Current Every Day Smoker    Types: Cigarettes  .  Smokeless tobacco: Not on file  . Alcohol Use: No      Review of Systems  Constitutional: Positive for fever and chills. Negative for activity change and appetite change.  HENT: Positive for congestion and sore throat. Negative for ear pain, facial swelling, trouble swallowing, neck pain, neck stiffness and voice change.   Eyes: Negative for pain and visual disturbance.  Respiratory: Negative for cough, chest tightness and shortness of breath.   Cardiovascular: Negative for chest pain.  Gastrointestinal: Negative for nausea, vomiting and abdominal pain.  Genitourinary: Negative for dysuria, flank pain and difficulty urinating.  Musculoskeletal: Positive for arthralgias. Negative for myalgias and joint swelling.  Skin: Negative for color change, rash and wound.  Neurological: Negative for dizziness, facial asymmetry, speech difficulty, weakness, numbness and headaches.  Hematological: Negative for adenopathy.  All other systems reviewed and are negative.    Allergies  Review of patient's allergies indicates no known allergies.  Home Medications  No current outpatient prescriptions on file.  BP 140/90  Pulse 80  Temp 99.4 F (37.4 C) (Oral)  Resp 18  Ht 5\' 9"  (1.753 m)  Wt 187 lb (84.823 kg)  BMI 27.62 kg/m2  SpO2 99%  Physical Exam  Nursing note and vitals reviewed. Constitutional: He is oriented to person, place, and time. He appears well-developed and well-nourished. No distress.  HENT:  Head: Normocephalic and atraumatic. No trismus in the jaw.  Right Ear: Tympanic membrane and ear canal normal.  Left Ear: Tympanic membrane and ear canal normal.  Mouth/Throat: Uvula is midline and mucous membranes are normal. No uvula swelling. Posterior  oropharyngeal edema and posterior oropharyngeal erythema present. No oropharyngeal exudate or tonsillar abscesses.  Neck: Normal range of motion and phonation normal. Neck supple. No Brudzinski's sign and no Kernig's sign noted.    Cardiovascular: Normal rate, regular rhythm, normal heart sounds and intact distal pulses.   No murmur heard. Pulmonary/Chest: Effort normal and breath sounds normal.  Musculoskeletal: Normal range of motion. He exhibits tenderness. He exhibits no edema.       Right elbow: He exhibits normal range of motion, no swelling, no effusion and no laceration. tenderness found. Lateral epicondyle tenderness noted.       Arms:      Localized tenderness to palpation along the lateral upper condyle and right forearm muscles. Radial pulse is brisk, distal sensation intact, cap refill less than 2 seconds. Patient has full range of motion. No edema or erythema of the joint.  Lymphadenopathy:    He has cervical adenopathy.       Right cervical: Superficial cervical adenopathy present.       Left cervical: Superficial cervical adenopathy present.  Neurological: He is alert and oriented to person, place, and time. He exhibits normal muscle tone. Coordination normal.  Skin: Skin is warm and dry.    ED Course  Procedures (including critical care time)  Results for orders placed during the hospital encounter of 10/06/12  RAPID STREP SCREEN      Component Value Range   Streptococcus, Group A Screen (Direct) NEGATIVE  NEGATIVE         MDM     Patient is nontoxic appearing. No focal neuro deficits on exam. Sore throat is likely viral given negative strep.  Tenderness to the right elbow is likely lateral epicondylitis. Doubtful of a septic joint. Patient agrees to followup with Dr. Hilda Lias next week   Prescribed:  lortab elixir Magic mouthwash ibuprofen    Febe Champa L. [place], [place] 10/06/12 1853

## 2012-10-06 NOTE — ED Notes (Signed)
Pt c/o sore throat x2 days and states his wife has strep throat.

## 2012-10-06 NOTE — ED Notes (Signed)
Pt reports sore throat for 2 days, wife dx wife strep throat.

## 2012-10-07 NOTE — ED Provider Notes (Signed)
Medical screening examination/treatment/procedure(s) were performed by non-physician practitioner and as supervising physician I was immediately available for consultation/collaboration.  Kristyanna Barcelo, MD 10/07/12 1515 

## 2012-10-09 ENCOUNTER — Encounter (HOSPITAL_COMMUNITY): Payer: Self-pay | Admitting: *Deleted

## 2013-04-24 ENCOUNTER — Ambulatory Visit (INDEPENDENT_AMBULATORY_CARE_PROVIDER_SITE_OTHER): Payer: Federal, State, Local not specified - PPO | Admitting: Gastroenterology

## 2013-04-24 ENCOUNTER — Encounter: Payer: Self-pay | Admitting: Gastroenterology

## 2013-04-24 VITALS — BP 126/80 | HR 75 | Temp 98.4°F | Ht 68.0 in | Wt 180.8 lb

## 2013-04-24 DIAGNOSIS — K2270 Barrett's esophagus without dysplasia: Secondary | ICD-10-CM

## 2013-04-24 DIAGNOSIS — K625 Hemorrhage of anus and rectum: Secondary | ICD-10-CM

## 2013-04-24 LAB — CBC WITH DIFFERENTIAL/PLATELET
Basophils Relative: 1 % (ref 0–1)
HCT: 43.5 % (ref 39.0–52.0)
Hemoglobin: 15.3 g/dL (ref 13.0–17.0)
Lymphocytes Relative: 48 % — ABNORMAL HIGH (ref 12–46)
Lymphs Abs: 4 10*3/uL (ref 0.7–4.0)
Monocytes Relative: 7 % (ref 3–12)
Neutro Abs: 3.3 10*3/uL (ref 1.7–7.7)
Neutrophils Relative %: 40 % — ABNORMAL LOW (ref 43–77)
RBC: 4.88 MIL/uL (ref 4.22–5.81)

## 2013-04-24 MED ORDER — HYDROCORTISONE ACETATE 25 MG RE SUPP: 25.0000 mg | Freq: Two times a day (BID) | RECTAL | Status: DC

## 2013-04-24 MED ORDER — ESOMEPRAZOLE MAGNESIUM 40 MG PO CPDR: 40.0000 mg | DELAYED_RELEASE_CAPSULE | Freq: Every day | ORAL | Status: DC

## 2013-04-24 MED ORDER — PEG 3350-KCL-NA BICARB-NACL 420 G PO SOLR: 4000.0000 mL | ORAL | Status: DC

## 2013-04-24 NOTE — Progress Notes (Signed)
Referring Provider: No ref. provider found Primary Care Physician:  PICKARD,WARREN TOM, MD Primary GI: Dr. Rourk   Chief Complaint  Patient presents with  . Rectal Bleeding    HPI:   [name] [name] is a pleasant 37-year-old male who presents today as an urgent work-in due to acute onset of large volume hematochezia. He has a history of short-segment Barrett's esophagus, first noted on EGD in March 2012. He has not had surveillance EGD, and he is not on a PPI currently. Denies reflux symptoms. Last colonoscopy in 2008 with friable anal canal, possible occult anal fissure.   Notes rectal bleeding last night and today. Notes a burning sensation in his rectum. Denies constipation/diarrhea. Notes epigastric discomfort since yesterday. Pain not associated with eating/drinking. No N/V. No melena. Denies fatigue, weakness. Denies NSAIDs, but his med list includes Voltaren.   Denies reflux issues. Has taken Prilosec, Nexium in past. Stopped Nexium due to fear of stomach cancer. Marijuana due to nausea.   Past Medical History  Diagnosis Date  . Barrett's esophagus     Past Surgical History  Procedure Laterality Date  . Esophagogastroduodenoscopy  12/23/2010    RMR: short-segment Barrett's  . Colonoscopy    07/09/2007    Friable anal canal, occult fissure not excluded  Otherwise, normal rectum, colon, and terminal ileum  . Hand surgery    . Hemorrhoid surgery    . Thumb surgery    . Shoulder surgery      Current Outpatient Prescriptions  Medication Sig Dispense Refill  . diclofenac (VOLTAREN) 75 MG EC tablet Take 75 mg by mouth 2 (two) times daily as needed (Pain).      . polyethylene glycol-electrolytes (TRILYTE) 420 G solution Take 4,000 mLs by mouth as directed.  4000 mL  0   No current facility-administered medications for this visit.    Allergies as of 04/24/2013  . (No Known Allergies)    Family History  Problem Relation Age of Onset  . Colon cancer Father     History    Social History  . Marital Status: Unknown    Spouse Name: N/A    Number of Children: N/A  . Years of Education: N/A   Social History Main Topics  . Smoking status: Current Every Day Smoker    Types: Cigarettes  . Smokeless tobacco: None  . Alcohol Use: No  . Drug Use: Yes    Special: Marijuana  . Sexually Active: No   Other Topics Concern  . None   Social History Narrative   ** Merged History Encounter **        Review of Systems: Negative unless mentioned in HPI.   Physical Exam: BP 126/80  Pulse 75  Temp(Src) 98.4 F (36.9 C) (Oral)  Ht 5' 8" (1.727 m)  Wt 180 lb 12.8 oz (82.01 kg)  BMI 27.5 kg/m2 General:   Alert and oriented. No distress noted. Pleasant and cooperative. Does not appear acutely ill.  Head:  Normocephalic and atraumatic. Eyes:  Conjuctiva clear without scleral icterus. Mouth:  Oral mucosa pink and moist. Good dentition. No lesions. Neck:  Supple, without mass or thyromegaly. Heart:  S1, S2 present without murmurs, rubs, or gallops. Regular rate and rhythm. Abdomen:  +BS, soft, non-tender and non-distended. No rebound or guarding. No HSM or masses noted. Rectal: PATIENT DECLINED Msk:  Symmetrical without gross deformities. Normal posture. Pulses:  2+ DP noted bilaterally Extremities:  Without edema. Neurologic:  Alert and  oriented x4;  grossly normal neurologically. Skin:    Intact without significant lesions or rashes. Cervical Nodes:  No significant cervical adenopathy. Psych:  Alert and cooperative. Normal mood and affect.  

## 2013-04-24 NOTE — Patient Instructions (Addendum)
Restart Nexium every day, 30 minutes before breakfast.   I have sent in a prescription for suppositories to help calm down any hemorrhoids.   Please have blood work done.  We have set you up for a colonoscopy and upper endoscopy with Dr. Jena Gauss in the near future.

## 2013-04-25 ENCOUNTER — Encounter: Payer: Self-pay | Admitting: Gastroenterology

## 2013-04-25 ENCOUNTER — Encounter (HOSPITAL_COMMUNITY): Payer: Self-pay | Admitting: Pharmacy Technician

## 2013-04-25 ENCOUNTER — Other Ambulatory Visit: Payer: Self-pay | Admitting: General Practice

## 2013-04-25 DIAGNOSIS — K625 Hemorrhage of anus and rectum: Secondary | ICD-10-CM

## 2013-04-25 DIAGNOSIS — K2270 Barrett's esophagus without dysplasia: Secondary | ICD-10-CM | POA: Insufficient documentation

## 2013-04-25 MED ORDER — HYDROCORTISONE ACETATE 25 MG RE SUPP: 25.0000 mg | Freq: Two times a day (BID) | RECTAL | Status: DC

## 2013-04-25 NOTE — Assessment & Plan Note (Addendum)
37 year old pleasant male, presenting today with acute onset of rectal bleeding. Mild rectal discomfort noted. Declined rectal exam. Last colonoscopy in 2008 with friable anal canal and possible occult fissure. He now tells me his father has a history of colon cancer; he is unable to tell me age of onset. With this new knowledge, I recommended repeat lower GI evaluation in the near future and a stat CBC today. He also notes vague epigastric discomfort for approximately 24 hours, without any aggravating factors. He is overdue for Barrett's surveillance, so I have discussed an EGD at time of TCS for surveillance purposes. Highly doubt rapid transit GI bleed; patient is in good spirits and does not appear acutely ill. Likely benign anorectal source.   Proceed with TCS/EGD with Dr. Jena Gauss in near future: the risks, benefits, and alternatives have been discussed with the patient in detail. The patient states understanding and desires to proceed. Phenergan 25 mg IV on call due to marijuana use Resume Nexium daily STAT CBC Start Anusol suppositories BID

## 2013-04-25 NOTE — Assessment & Plan Note (Signed)
EGD at time of TCS for surveillance. Last EGD in March 2012, short-segment Barrett's. Overdue for 1-year-surveillance.

## 2013-04-25 NOTE — Progress Notes (Signed)
Quick Note:  CBC reviewed. Hgb 15.3. WBC 8.2. Attempted to call patient to inform. Proceed with TCS/EGD. ______

## 2013-04-25 NOTE — Progress Notes (Signed)
Cc PCP 

## 2013-04-26 ENCOUNTER — Ambulatory Visit (HOSPITAL_COMMUNITY)
Admission: RE | Admit: 2013-04-26 | Discharge: 2013-04-26 | Disposition: A | Discharge status: Home / Self Care | Payer: Federal, State, Local not specified - PPO | Source: Ambulatory Visit | Attending: Internal Medicine | Admitting: Internal Medicine

## 2013-04-26 ENCOUNTER — Encounter (HOSPITAL_COMMUNITY): Payer: Self-pay | Admitting: *Deleted

## 2013-04-26 ENCOUNTER — Encounter (HOSPITAL_COMMUNITY)
Admission: RE | Disposition: A | Discharge status: Home / Self Care | Payer: Self-pay | Source: Ambulatory Visit | Attending: Internal Medicine

## 2013-04-26 DIAGNOSIS — K648 Other hemorrhoids: Secondary | ICD-10-CM | POA: Insufficient documentation

## 2013-04-26 DIAGNOSIS — K2100 Gastro-esophageal reflux disease with esophagitis, without bleeding: Secondary | ICD-10-CM | POA: Insufficient documentation

## 2013-04-26 DIAGNOSIS — K2270 Barrett's esophagus without dysplasia: Secondary | ICD-10-CM | POA: Insufficient documentation

## 2013-04-26 DIAGNOSIS — K921 Melena: Secondary | ICD-10-CM

## 2013-04-26 DIAGNOSIS — K625 Hemorrhage of anus and rectum: Secondary | ICD-10-CM

## 2013-04-26 HISTORY — PX: COLONOSCOPY WITH ESOPHAGOGASTRODUODENOSCOPY (EGD): SHX5779

## 2013-04-26 HISTORY — DX: Gastro-esophageal reflux disease without esophagitis: K21.9

## 2013-04-26 HISTORY — DX: Other bursitis of elbow, unspecified elbow: M70.30

## 2013-04-26 SURGERY — COLONOSCOPY WITH ESOPHAGOGASTRODUODENOSCOPY (EGD)
Anesthesia: Moderate Sedation

## 2013-04-26 MED ORDER — STERILE WATER FOR IRRIGATION IR SOLN
Status: DC | PRN
  Administered 2013-04-26: 16:00:00

## 2013-04-26 MED ORDER — MIDAZOLAM HCL 5 MG/5ML IJ SOLN
INTRAMUSCULAR | Status: AC
  Filled 2013-04-26: qty 10

## 2013-04-26 MED ORDER — ONDANSETRON HCL 4 MG/2ML IJ SOLN
INTRAMUSCULAR | Status: DC | PRN
  Administered 2013-04-26: 4 mg via INTRAVENOUS

## 2013-04-26 MED ORDER — BUTAMBEN-TETRACAINE-BENZOCAINE 2-2-14 % EX AERO
INHALATION_SPRAY | CUTANEOUS | Status: DC | PRN
  Administered 2013-04-26: 2 via TOPICAL

## 2013-04-26 MED ORDER — MEPERIDINE HCL 100 MG/ML IJ SOLN
INTRAMUSCULAR | Status: AC
  Filled 2013-04-26: qty 2

## 2013-04-26 MED ORDER — PROMETHAZINE HCL 25 MG/ML IJ SOLN
INTRAMUSCULAR | Status: AC
  Filled 2013-04-26: qty 1

## 2013-04-26 MED ORDER — SODIUM CHLORIDE 0.9 % IV SOLN
INTRAVENOUS | Status: DC
  Administered 2013-04-26: 20 mL/h via INTRAVENOUS

## 2013-04-26 MED ORDER — MIDAZOLAM HCL 5 MG/5ML IJ SOLN
INTRAMUSCULAR | Status: DC | PRN
  Administered 2013-04-26: 2 mg via INTRAVENOUS
  Administered 2013-04-26 (×3): 1 mg via INTRAVENOUS

## 2013-04-26 MED ORDER — PROMETHAZINE HCL 25 MG/ML IJ SOLN
25.0000 mg | Freq: Once | INTRAMUSCULAR | Status: AC
  Administered 2013-04-26: 25 mg via INTRAVENOUS

## 2013-04-26 MED ORDER — ONDANSETRON HCL 4 MG/2ML IJ SOLN
INTRAMUSCULAR | Status: AC
  Filled 2013-04-26: qty 2

## 2013-04-26 MED ORDER — MEPERIDINE HCL 100 MG/ML IJ SOLN
INTRAMUSCULAR | Status: DC | PRN
  Administered 2013-04-26: 25 mg via INTRAVENOUS
  Administered 2013-04-26: 50 mg via INTRAVENOUS
  Administered 2013-04-26: 25 mg via INTRAVENOUS

## 2013-04-26 MED ORDER — SODIUM CHLORIDE 0.9 % IJ SOLN
INTRAMUSCULAR | Status: AC
  Filled 2013-04-26: qty 10

## 2013-04-26 NOTE — Op Note (Signed)
[facility] [address] [place], [address]   COLONOSCOPY PROCEDURE REPORT  PATIENT: [name] [name], [name] [name]  MR#:         [id number] BIRTHDATE: [birth year] , 36  yrs. old GENDER: Male ENDOSCOPIST: R.  Roetta Sessions, MD FACP FACG REFERRED BY:  Lynnea Ferrier, M.D. PROCEDURE DATE:  04/26/2013 PROCEDURE:     Colonoscopy-diagnostic  INDICATIONS: 2 episodes of hematochezia; none in several weeks.  INFORMED CONSENT:  The risks, benefits, alternatives and imponderables including but not limited to bleeding, perforation as well as the possibility of a missed lesion have been reviewed.  The potential for biopsy, lesion removal, etc. have also been discussed.  Questions have been answered.  All parties agreeable. Please see the history and physical in the medical record for more information.  MEDICATIONS: Versed 4 mg IV and Demerol 75 mg IV in divided doses. Zofran 4 mg IV  DESCRIPTION OF PROCEDURE:  After a digital rectal exam was performed, the EC-[id number] ([id number])  colonoscope was advanced from the anus through the rectum and colon to the area of the cecum, ileocecal valve and appendiceal orifice.  The cecum was deeply intubated.  These structures were well-seen and photographed for the record.  From the level of the cecum and ileocecal valve, the scope was slowly and cautiously withdrawn.  The mucosal surfaces were carefully surveyed utilizing scope tip deflection to facilitate fold flattening as needed.  The scope was pulled down into the rectum where a thorough examination including retroflexion was performed.    FINDINGS:    Adequate preparation. Normal rectum aside from internal hemorrhoids. Normal-appearing colonic mucosa.  THERAPEUTIC / DIAGNOSTIC MANEUVERS PERFORMED:  None  COMPLICATIONS: none  CECAL WITHDRAWAL TIME:  7 minutes  IMPRESSION:  Internal hemorrhoids otherwise normal rectum and colon.  RECOMMENDATIONS: If bleeding recurs, can consider  hemorrhoidal banding. See EGD report   _______________________________ eSigned:  R. Roetta Sessions, MD FACP [facility] 04/26/2013 4:42 PM   CC:

## 2013-04-26 NOTE — Interval H&P Note (Signed)
History and Physical Interval Note:  04/26/2013 3:50 PM  [name] [name]  has presented today for surgery, with the diagnosis of RECTAL BLEEDING, DYSPAHGIA  The various methods of treatment have been discussed with the patient and family. After consideration of risks, benefits and other options for treatment, the patient has consented to  Procedure(s) with comments: COLONOSCOPY WITH ESOPHAGOGASTRODUODENOSCOPY (EGD) (N/A) - 3:15 PM as a surgical intervention .  The patient's history has been reviewed, patient examined, no change in status, stable for surgery.  I have reviewed the patient's chart and labs.  Questions were answered to the patient's satisfaction.     He no change. EGD for surveillance of Barrett's. Colonoscopy for hematochezia.The risks, benefits, limitations, imponderables and alternatives regarding both EGD and colonoscopy have been reviewed with the patient. Questions have been answered. All parties agreeable.   Eula Listen

## 2013-04-26 NOTE — Op Note (Signed)
[facility] [address] [place], [address]   ENDOSCOPY PROCEDURE REPORT  PATIENT: [name] [name], [name] [name]  MR#: [id number] BIRTHDATE: 05-Oct-1976 , 36  yrs. old GENDER: Male ENDOSCOPIST: R.  Roetta Sessions, MD FACP FACG REFERRED BY:  Lynnea Ferrier, M.D. PROCEDURE DATE:  04/26/2013 PROCEDURE:     EGD with esophageal biopsy  INDICATIONS:     History of Barrett's esophagus; surveillance examination  INFORMED CONSENT:   The risks, benefits, limitations, alternatives and imponderables have been discussed.  The potential for biopsy, esophogeal dilation, etc. have also been reviewed.  Questions have been answered.  All parties agreeable.  Please see the history and physical in the medical record for more information.  MEDICATIONS:      Versed 4 mg IV and Demerol 75 mg IV in divided doses. Zofran 4 mg IV. Cetacaine spray.  DESCRIPTION OF PROCEDURE:   The EG-[id number] ([id number])  endoscope was introduced through the mouth and advanced to the second portion of the duodenum without difficulty or limitations.  The mucosal surfaces were surveyed very carefully during advancement of the scope and upon withdrawal.  Retroflexion view of the proximal stomach and esophagogastric junction was performed.      FINDINGS:   Distal esophageal erosions within 5 mm the GE junction. Salmon - colored epithelium coming up 1.5-2 cm above the GE junction. The above-mentioned erosions were just above the demarcation of the normal appearing esophageal mucosa and salmon-colored epithelium. No stricture. No tumor. Stomach empty. Small hiatal hernia. Abnormal gastric mucosa. Patent pylorus. Normal first and second portion of the duodenum.  THERAPEUTIC / DIAGNOSTIC MANEUVERS PERFORMED:   biopsies normal; epithelium taken for histologic study   COMPLICATIONS:  None  IMPRESSION:   Erosive reflux esophagitis. Abnormal distal esophagus consistent with prior diagnosis of Barrett's esophagus-status  post biopsy.  RECOMMENDATIONS:  Resume Nexium as previously instructed. Followup on pathology. See colonoscopy report.    _______________________________ R. Roetta Sessions, MD FACP [facility] eSigned:  R. Roetta Sessions, MD FACP [facility] 04/26/2013 4:14 PM     CC:

## 2013-04-26 NOTE — H&P (View-Only) (Signed)
Referring Provider: No ref. provider found Primary Care Physician:  Leo Grosser, MD Primary GI: Dr. Jena Gauss   Chief Complaint  Patient presents with  . Rectal Bleeding    HPI:   [name] [name] is a pleasant 37 year old male who presents today as an urgent work-in due to acute onset of large volume hematochezia. He has a history of short-segment Barrett's esophagus, first noted on EGD in March 2012. He has not had surveillance EGD, and he is not on a PPI currently. Denies reflux symptoms. Last colonoscopy in 2008 with friable anal canal, possible occult anal fissure.   Notes rectal bleeding last night and today. Notes a burning sensation in his rectum. Denies constipation/diarrhea. Notes epigastric discomfort since yesterday. Pain not associated with eating/drinking. No N/V. No melena. Denies fatigue, weakness. Denies NSAIDs, but his med list includes Voltaren.   Denies reflux issues. Has taken Prilosec, Nexium in past. Stopped Nexium due to fear of stomach cancer. Marijuana due to nausea.   Past Medical History  Diagnosis Date  . Barrett's esophagus     Past Surgical History  Procedure Laterality Date  . Esophagogastroduodenoscopy  12/23/2010    RMR: short-segment Barrett's  . Colonoscopy    07/09/2007    Friable anal canal, occult fissure not excluded  Otherwise, normal rectum, colon, and terminal ileum  . Hand surgery    . Hemorrhoid surgery    . Thumb surgery    . Shoulder surgery      Current Outpatient Prescriptions  Medication Sig Dispense Refill  . diclofenac (VOLTAREN) 75 MG EC tablet Take 75 mg by mouth 2 (two) times daily as needed (Pain).      . polyethylene glycol-electrolytes (TRILYTE) 420 G solution Take 4,000 mLs by mouth as directed.  4000 mL  0   No current facility-administered medications for this visit.    Allergies as of 04/24/2013  . (No Known Allergies)    Family History  Problem Relation Age of Onset  . Colon cancer Father     History    Social History  . Marital Status: Unknown    Spouse Name: N/A    Number of Children: N/A  . Years of Education: N/A   Social History Main Topics  . Smoking status: Current Every Day Smoker    Types: Cigarettes  . Smokeless tobacco: None  . Alcohol Use: No  . Drug Use: Yes    Special: Marijuana  . Sexually Active: No   Other Topics Concern  . None   Social History Narrative   ** Merged History Encounter **        Review of Systems: Negative unless mentioned in HPI.   Physical Exam: BP 126/80  Pulse 75  Temp(Src) 98.4 F (36.9 C) (Oral)  Ht 5\' 8"  (1.727 m)  Wt 180 lb 12.8 oz (82.01 kg)  BMI 27.5 kg/m2 General:   Alert and oriented. No distress noted. Pleasant and cooperative. Does not appear acutely ill.  Head:  Normocephalic and atraumatic. Eyes:  Conjuctiva clear without scleral icterus. Mouth:  Oral mucosa pink and moist. Good dentition. No lesions. Neck:  Supple, without mass or thyromegaly. Heart:  S1, S2 present without murmurs, rubs, or gallops. Regular rate and rhythm. Abdomen:  +BS, soft, non-tender and non-distended. No rebound or guarding. No HSM or masses noted. Rectal: PATIENT DECLINED Msk:  Symmetrical without gross deformities. Normal posture. Pulses:  2+ DP noted bilaterally Extremities:  Without edema. Neurologic:  Alert and  oriented x4;  grossly normal neurologically. Skin:  Intact without significant lesions or rashes. Cervical Nodes:  No significant cervical adenopathy. Psych:  Alert and cooperative. Normal mood and affect.

## 2013-05-01 ENCOUNTER — Encounter (HOSPITAL_COMMUNITY): Payer: Self-pay | Admitting: Internal Medicine

## 2013-05-03 ENCOUNTER — Encounter: Payer: Self-pay | Admitting: Internal Medicine

## 2013-08-15 ENCOUNTER — Other Ambulatory Visit: Payer: Self-pay

## 2013-08-27 ENCOUNTER — Encounter (HOSPITAL_COMMUNITY): Payer: Self-pay | Admitting: Emergency Medicine

## 2013-08-27 ENCOUNTER — Emergency Department (HOSPITAL_COMMUNITY)
Admission: EM | Admit: 2013-08-27 | Discharge: 2013-08-27 | Disposition: A | Discharge status: Home / Self Care | Payer: Federal, State, Local not specified - PPO | Attending: Emergency Medicine | Admitting: Emergency Medicine

## 2013-08-27 DIAGNOSIS — Z8719 Personal history of other diseases of the digestive system: Secondary | ICD-10-CM | POA: Insufficient documentation

## 2013-08-27 DIAGNOSIS — L02419 Cutaneous abscess of limb, unspecified: Secondary | ICD-10-CM

## 2013-08-27 DIAGNOSIS — Z79899 Other long term (current) drug therapy: Secondary | ICD-10-CM | POA: Insufficient documentation

## 2013-08-27 DIAGNOSIS — F17200 Nicotine dependence, unspecified, uncomplicated: Secondary | ICD-10-CM | POA: Insufficient documentation

## 2013-08-27 DIAGNOSIS — Z8739 Personal history of other diseases of the musculoskeletal system and connective tissue: Secondary | ICD-10-CM | POA: Insufficient documentation

## 2013-08-27 DIAGNOSIS — IMO0002 Reserved for concepts with insufficient information to code with codable children: Secondary | ICD-10-CM | POA: Insufficient documentation

## 2013-08-27 HISTORY — DX: Enthesopathy, unspecified: M77.9

## 2013-08-27 MED ORDER — LIDOCAINE HCL (PF) 1 % IJ SOLN
INTRAMUSCULAR | Status: AC
  Administered 2013-08-27: 13:00:00
  Filled 2013-08-27: qty 5

## 2013-08-27 MED ORDER — MUPIROCIN CALCIUM 2 % EX CREA: TOPICAL_CREAM | CUTANEOUS | Status: DC

## 2013-08-27 MED ORDER — OXYCODONE-ACETAMINOPHEN 5-325 MG PO TABS: 1.0000 | ORAL_TABLET | ORAL | Status: DC | PRN

## 2013-08-27 MED ORDER — SULFAMETHOXAZOLE-TRIMETHOPRIM 800-160 MG PO TABS: 1.0000 | ORAL_TABLET | Freq: Two times a day (BID) | ORAL | Status: DC

## 2013-08-27 NOTE — ED Provider Notes (Signed)
CSN: [id number]     Arrival date & time 08/27/13  1047 History   First MD Initiated Contact with Patient 08/27/13 1134     Chief Complaint  Patient presents with  . Abscess   (Consider location/radiation/quality/duration/timing/severity/associated sxs/prior Treatment) Patient is a 37 y.o. male presenting with abscess. The history is provided by the patient.  Abscess Location:  Shoulder/arm Shoulder/arm abscess location:  R forearm Abscess quality: induration, painful and redness   Abscess quality: not draining and no fluctuance   Red streaking: no   Duration:  1 week Progression:  Worsening Pain details:    Quality:  Throbbing and pressure   Severity:  Moderate   Timing:  Constant   Progression:  Worsening Chronicity:  New Context: not diabetes   Context comment:  Recent tattoo Relieved by:  Nothing Worsened by:  Nothing tried Ineffective treatments:  None tried Associated symptoms: no fever, no nausea and no vomiting   Risk factors: no hx of MRSA and no prior abscess     Past Medical History  Diagnosis Date  . Barrett's esophagus   . GERD (gastroesophageal reflux disease)   . Bursitis of elbow     Right  . Bone spur    Past Surgical History  Procedure Laterality Date  . Esophagogastroduodenoscopy  12/23/2010    RMR: short-segment Barrett's  . Colonoscopy    07/09/2007    Friable anal canal, occult fissure not excluded  Otherwise, normal rectum, colon, and terminal ileum  . Hand surgery    . Hemorrhoid surgery    . Thumb surgery    . Shoulder surgery    . Colonoscopy with esophagogastroduodenoscopy (egd) N/A 04/26/2013    Procedure: COLONOSCOPY WITH ESOPHAGOGASTRODUODENOSCOPY (EGD);  Surgeon: Corbin Ade, MD;  Location: [facility];  Service: Endoscopy;  Laterality: N/A;  3:15 PM   Family History  Problem Relation Age of Onset  . Colon cancer Father    History  Substance Use Topics  . Smoking status: Current Every Day Smoker -- 1.00 packs/day for 30  years    Types: Cigarettes  . Smokeless tobacco: Not on file  . Alcohol Use: No    Review of Systems  Constitutional: Negative for fever and chills.  Gastrointestinal: Negative for nausea and vomiting.  Musculoskeletal: Negative for arthralgias and joint swelling.  Skin: Positive for color change.       Abscess   Hematological: Negative for adenopathy.  All other systems reviewed and are negative.    Allergies  Review of patient's allergies indicates no known allergies.  Home Medications   Current Outpatient Rx  Name  Route  Sig  Dispense  Refill  . diclofenac (VOLTAREN) 75 MG EC tablet   Oral   Take 75 mg by mouth 2 (two) times daily as needed (Pain).         . mupirocin cream (BACTROBAN) 2 %      Apply to affected area TID x 10 days   15 g   0   . oxyCODONE-acetaminophen (PERCOCET/ROXICET) 5-325 MG per tablet   Oral   Take 1 tablet by mouth every 4 (four) hours as needed for severe pain.   8 tablet   0   . sulfamethoxazole-trimethoprim (SEPTRA DS) 800-160 MG per tablet   Oral   Take 1 tablet by mouth 2 (two) times daily.   20 tablet   0    BP 135/93  Pulse 70  Temp(Src) 97.5 F (36.4 C) (Oral)  Resp 18  Ht  5\' 9"  (1.753 m)  Wt 187 lb (84.823 kg)  BMI 27.60 kg/m2  SpO2 100% Physical Exam  Nursing note and vitals reviewed. Constitutional: He is oriented to person, place, and time. He appears well-developed and well-nourished. No distress.  HENT:  Head: Normocephalic and atraumatic.  Cardiovascular: Normal rate, regular rhythm and normal heart sounds.   No murmur heard. Pulmonary/Chest: Effort normal and breath sounds normal. No respiratory distress.  Neurological: He is alert and oriented to person, place, and time. He exhibits normal muscle tone. Coordination normal.  Skin: Skin is warm and dry. There is erythema.  Localized area of induration and erythema to right mid forearm.  No drainage or fluctuance.  Pt has full ROM, NV intact.     ED  Course  Procedures (including critical care time) Labs Review Labs Reviewed - No data to display Imaging Review No results found.  EKG Interpretation   None       MDM   1. Abscess of arm    INCISION AND DRAINAGE Performed by: Maxwell Caul. Consent: Verbal consent obtained. Risks and benefits: risks, benefits and alternatives were discussed Type: abscess  Body area: right forearm Anesthesia: local infiltration  Incision was made with a #11 scalpel.  Local anesthetic: lidocaine 1% w/o epinephrine  Anesthetic total: 2 ml  Complexity: complex Blunt dissection to break up loculations  Drainage: purulent  Drainage amount: small  Packing material: 1/4 in iodoform gauze  Patient tolerance: Patient tolerated the procedure well with no immediate complications.     Pt agrees to remove packing in 2 days, keep abscess bandaged, bactrim and percocet #8 for pain.  He also agrees to return here if needed.  VSS.  He is well appearing and stable for discharge.  Anderson Middlebrooks L. Trisha Mangle, PA-C 08/28/13 2044

## 2013-08-27 NOTE — ED Notes (Signed)
Pt with abscess to right forearm x 1 week, denies vomiting or fever, + nausea

## 2013-08-29 NOTE — ED Provider Notes (Signed)
Medical screening examination/treatment/procedure(s) were performed by non-physician practitioner and as supervising physician I was immediately available for consultation/collaboration.  EKG Interpretation   None        Denym Rahimi, MD 08/29/13 1316 

## 2014-04-16 ENCOUNTER — Encounter: Payer: Self-pay | Admitting: Internal Medicine

## 2014-04-28 ENCOUNTER — Encounter (HOSPITAL_COMMUNITY): Payer: Self-pay | Admitting: Emergency Medicine

## 2014-04-28 ENCOUNTER — Emergency Department (HOSPITAL_COMMUNITY)
Admission: EM | Admit: 2014-04-28 | Discharge: 2014-04-28 | Disposition: A | Discharge status: Home / Self Care | Payer: Federal, State, Local not specified - PPO | Attending: Emergency Medicine | Admitting: Emergency Medicine

## 2014-04-28 ENCOUNTER — Emergency Department (HOSPITAL_COMMUNITY): Payer: Federal, State, Local not specified - PPO

## 2014-04-28 DIAGNOSIS — Z23 Encounter for immunization: Secondary | ICD-10-CM | POA: Insufficient documentation

## 2014-04-28 DIAGNOSIS — S91009A Unspecified open wound, unspecified ankle, initial encounter: Principal | ICD-10-CM

## 2014-04-28 DIAGNOSIS — F17200 Nicotine dependence, unspecified, uncomplicated: Secondary | ICD-10-CM | POA: Insufficient documentation

## 2014-04-28 DIAGNOSIS — Z8739 Personal history of other diseases of the musculoskeletal system and connective tissue: Secondary | ICD-10-CM | POA: Insufficient documentation

## 2014-04-28 DIAGNOSIS — Y929 Unspecified place or not applicable: Secondary | ICD-10-CM | POA: Insufficient documentation

## 2014-04-28 DIAGNOSIS — T148XXA Other injury of unspecified body region, initial encounter: Secondary | ICD-10-CM

## 2014-04-28 DIAGNOSIS — Y9389 Activity, other specified: Secondary | ICD-10-CM | POA: Insufficient documentation

## 2014-04-28 DIAGNOSIS — S81009A Unspecified open wound, unspecified knee, initial encounter: Secondary | ICD-10-CM | POA: Insufficient documentation

## 2014-04-28 DIAGNOSIS — Z8719 Personal history of other diseases of the digestive system: Secondary | ICD-10-CM | POA: Insufficient documentation

## 2014-04-28 DIAGNOSIS — W268XXA Contact with other sharp object(s), not elsewhere classified, initial encounter: Secondary | ICD-10-CM | POA: Insufficient documentation

## 2014-04-28 DIAGNOSIS — S81809A Unspecified open wound, unspecified lower leg, initial encounter: Principal | ICD-10-CM

## 2014-04-28 MED ORDER — CEPHALEXIN 500 MG PO CAPS
500.0000 mg | ORAL_CAPSULE | Freq: Once | ORAL | Status: AC
  Administered 2014-04-28: 500 mg via ORAL
  Filled 2014-04-28: qty 1

## 2014-04-28 MED ORDER — LIDOCAINE HCL (PF) 1 % IJ SOLN
5.0000 mL | Freq: Once | INTRAMUSCULAR | Status: AC
  Administered 2014-04-28: 5 mL via INTRADERMAL
  Filled 2014-04-28: qty 5

## 2014-04-28 MED ORDER — CEPHALEXIN 500 MG PO CAPS: 500.0000 mg | ORAL_CAPSULE | Freq: Four times a day (QID) | ORAL | Status: DC

## 2014-04-28 MED ORDER — OXYCODONE-ACETAMINOPHEN 5-325 MG PO TABS
1.0000 | ORAL_TABLET | Freq: Once | ORAL | Status: AC
  Administered 2014-04-28: 1 via ORAL
  Filled 2014-04-28: qty 1

## 2014-04-28 MED ORDER — OXYCODONE-ACETAMINOPHEN 5-325 MG PO TABS: 1.0000 | ORAL_TABLET | ORAL | Status: DC | PRN

## 2014-04-28 MED ORDER — POVIDONE-IODINE 10 % EX SOLN
CUTANEOUS | Status: AC
  Administered 2014-04-28: 17:00:00
  Filled 2014-04-28: qty 118

## 2014-04-28 MED ORDER — TETANUS-DIPHTH-ACELL PERTUSSIS 5-2.5-18.5 LF-MCG/0.5 IM SUSP
0.5000 mL | Freq: Once | INTRAMUSCULAR | Status: AC
  Administered 2014-04-28: 0.5 mL via INTRAMUSCULAR
  Filled 2014-04-28: qty 0.5

## 2014-04-28 NOTE — ED Provider Notes (Signed)
CSN: [id number]     Arrival date & time 04/28/14  1514 History   First MD Initiated Contact with Patient 04/28/14 1540     Chief Complaint  Patient presents with  . Foreign Body   [name] [name] is a 38 y.o. male who presents to the Emergency Department complaining of possible metallic foreign body to the skin of the right upper leg.  Patient states he was hammering on a metal stake and felt a sliver puncture his leg.  Painful to touch.  He denies bleeding, swelling or numbness.  Pt is unsure of last Td.    (Consider location/radiation/quality/duration/timing/severity/associated sxs/prior Treatment) Patient is a 38 y.o. male presenting with foreign body. The history is provided by the patient.  Foreign Body Location:  Skin Suspected object:  Metal Pain quality:  Throbbing and sharp Pain severity:  Moderate Duration:  4 hours Timing:  Constant Progression:  Unchanged Chronicity:  New Exacerbated by: palpation. Ineffective treatments:  None tried   Past Medical History  Diagnosis Date  . Barrett's esophagus   . GERD (gastroesophageal reflux disease)   . Bursitis of elbow     Right  . Bone spur    Past Surgical History  Procedure Laterality Date  . Esophagogastroduodenoscopy  12/23/2010    RMR: short-segment Barrett's  . Colonoscopy    07/09/2007    Friable anal canal, occult fissure not excluded  Otherwise, normal rectum, colon, and terminal ileum  . Hand surgery    . Hemorrhoid surgery    . Thumb surgery    . Shoulder surgery    . Colonoscopy with esophagogastroduodenoscopy (egd) N/A 04/26/2013    Procedure: COLONOSCOPY WITH ESOPHAGOGASTRODUODENOSCOPY (EGD);  Surgeon: Daneil Dolin, MD;  Location: [facility];  Service: Endoscopy;  Laterality: N/A;  3:15 PM   Family History  Problem Relation Age of Onset  . Colon cancer Father    History  Substance Use Topics  . Smoking status: Current Every Day Smoker -- 1.00 packs/day for 30 years    Types: Cigarettes  .  Smokeless tobacco: Not on file  . Alcohol Use: No    Review of Systems  Constitutional: Negative for fever and chills.  Genitourinary: Negative for dysuria and difficulty urinating.  Musculoskeletal: Negative for arthralgias, back pain and joint swelling.  Skin: Positive for wound. Negative for color change.       Puncture wound right thigh, possible FB  Neurological: Negative for dizziness, weakness and numbness.  Hematological: Does not bruise/bleed easily.  All other systems reviewed and are negative.     Allergies  Review of patient's allergies indicates no known allergies.  Home Medications   Prior to Admission medications   Not on File   BP 126/91  Pulse 87  Temp(Src) 97.8 F (36.6 C) (Oral)  Resp 18  Ht 5\' 8"  (1.727 m)  Wt 187 lb (84.823 kg)  BMI 28.44 kg/m2  SpO2 99% Physical Exam  Nursing note and vitals reviewed. Constitutional: He is oriented to person, place, and time. He appears well-developed and well-nourished. No distress.  HENT:  Head: Normocephalic and atraumatic.  Cardiovascular: Normal rate, regular rhythm, normal heart sounds and intact distal pulses.   No murmur heard. Pulmonary/Chest: Effort normal and breath sounds normal. No respiratory distress.  Musculoskeletal: He exhibits no edema and no tenderness.  Neurological: He is alert and oriented to person, place, and time. He exhibits normal muscle tone. Coordination normal.  Skin: Skin is warm. No laceration noted.     <  1 cm puncture wound to medial right upper thigh.  No obvious FB's, bleeding or edema.       ED Course  FOREIGN BODY REMOVAL Date/Time: 04/28/2014 4:45 PM Performed by: Vanessa Superior, Vinay Ertl L. Authorized by: Hale Bogus Consent: Verbal consent obtained. Risks and benefits: risks, benefits and alternatives were discussed Consent given by: patient Patient understanding: patient states understanding of the procedure being performed Patient consent: the patient's  understanding of the procedure matches consent given Site marked: the operative site was marked Imaging studies: imaging studies available Patient identity confirmed: verbally with patient and arm band Time out: Immediately prior to procedure a "time out" was called to verify the correct patient, procedure, equipment, support staff and site/side marked as required. Body area: skin General location: lower extremity Location details: right upper leg Anesthesia: local infiltration Local anesthetic: lidocaine 1% without epinephrine Anesthetic total: 2 ml Patient sedated: no Patient restrained: no Patient cooperative: yes Localization method: probed Removal mechanism: irrigation, scalpel and forceps Dressing: dressing applied Tendon involvement: none Complexity: simple 0 objects recovered. Post-procedure assessment: foreign body not removed Patient tolerance: Patient tolerated the procedure well with no immediate complications. Comments: Wound was explored, FB not found.     (including critical care time) Labs Review Labs Reviewed - No data to display  Imaging Review Dg Femur Right  04/28/2014   CLINICAL DATA:  Injury, laceration.  EXAM: RIGHT FEMUR - 2 VIEW  COMPARISON:  None.  FINDINGS: A 0.4 cm radiopaque density projects in the medial soft tissues of the right thigh. There is no fracture or dislocation.  IMPRESSION: 0.3 cm radiopaque density in the medial subcutaneous soft tissues of the right thigh.   Electronically Signed   By: Inge Rise M.D.   On: 04/28/2014 16:18     EKG Interpretation None        MDM   Final diagnoses:  Foreign body in skin    Pt is well appearing, ambulates with steady gait.  < 1 cm puncture wound to the medial right upper leg. Risks were explained to patient prior to procedure.   No obvious FB seen or palpated.  Bleeding controlled.  Unable to remove FB despite exploring superficial layers.  Pt aware and verbalized understanding that he will  need f/u with orthopedics.  He requests f/u with Dr. Luna Glasgow and agrees to contact his office for arrangements since Dr. Luna Glasgow is not on call today.    Td was updated,  No active bleeding, pt ambulates w/o difficulty,  rx's for keflex and percocet.  He appears stable for d/c    Halo Shevlin L. Vanessa Ellenton, PA-C 04/29/14 1804

## 2014-04-28 NOTE — ED Notes (Addendum)
While working at home, patient hammering when a piece of rusty metal flew off and struck him in the right inner thigh.

## 2014-04-29 NOTE — ED Provider Notes (Signed)
Medical screening examination/treatment/procedure(s) were performed by non-physician practitioner and as supervising physician I was immediately available for consultation/collaboration.   EKG Interpretation None        Merryl Hacker, MD 04/29/14 2207

## 2014-07-11 ENCOUNTER — Other Ambulatory Visit (HOSPITAL_COMMUNITY): Payer: Self-pay | Admitting: Orthopaedic Surgery

## 2014-07-11 DIAGNOSIS — M25562 Pain in left knee: Secondary | ICD-10-CM

## 2014-07-16 ENCOUNTER — Ambulatory Visit (HOSPITAL_COMMUNITY)
Admission: RE | Admit: 2014-07-16 | Discharge: 2014-07-16 | Disposition: A | Discharge status: Home / Self Care | Payer: Federal, State, Local not specified - PPO | Source: Ambulatory Visit | Attending: Orthopaedic Surgery | Admitting: Orthopaedic Surgery

## 2014-07-16 DIAGNOSIS — M25562 Pain in left knee: Secondary | ICD-10-CM | POA: Diagnosis present

## 2014-07-16 DIAGNOSIS — W19XXXA Unspecified fall, initial encounter: Secondary | ICD-10-CM | POA: Diagnosis not present

## 2014-07-16 DIAGNOSIS — S8012XA Contusion of left lower leg, initial encounter: Secondary | ICD-10-CM | POA: Diagnosis not present

## 2014-07-17 ENCOUNTER — Other Ambulatory Visit (HOSPITAL_COMMUNITY): Payer: Federal, State, Local not specified - PPO

## 2014-10-10 HISTORY — PX: HEMORRHOID BANDING: SHX5850

## 2014-11-27 ENCOUNTER — Other Ambulatory Visit: Payer: Self-pay

## 2014-11-27 ENCOUNTER — Encounter: Payer: Self-pay | Admitting: Internal Medicine

## 2014-11-27 ENCOUNTER — Encounter: Payer: Self-pay | Admitting: Gastroenterology

## 2014-11-27 ENCOUNTER — Ambulatory Visit (INDEPENDENT_AMBULATORY_CARE_PROVIDER_SITE_OTHER): Payer: Federal, State, Local not specified - PPO | Admitting: Gastroenterology

## 2014-11-27 VITALS — BP 120/79 | HR 61 | Temp 98.6°F | Ht 69.0 in | Wt 177.6 lb

## 2014-11-27 DIAGNOSIS — K625 Hemorrhage of anus and rectum: Secondary | ICD-10-CM

## 2014-11-27 DIAGNOSIS — K2270 Barrett's esophagus without dysplasia: Secondary | ICD-10-CM

## 2014-11-27 MED ORDER — NITROGLYCERIN 0.4 % RE OINT: 1.0000 [in_us] | TOPICAL_OINTMENT | Freq: Two times a day (BID) | RECTAL | Status: DC

## 2014-11-27 MED ORDER — PANTOPRAZOLE SODIUM 40 MG PO TBEC: 40.0000 mg | DELAYED_RELEASE_TABLET | Freq: Every day | ORAL | Status: DC

## 2014-11-27 MED ORDER — HYDROCORTISONE 2.5 % RE CREA: 1.0000 "application " | TOPICAL_CREAM | Freq: Two times a day (BID) | RECTAL | Status: DC

## 2014-11-27 NOTE — Progress Notes (Signed)
       Referring Provider: Susy Frizzle, MD Primary Care Physician:  Odette Fraction, MD  Primary GI: Dr. Gala Romney   Chief Complaint  Patient presents with  . Rectal Bleeding    when wiping  . Hemorrhoids    HPI:   [name] [name] is a 39 y.o. male presenting today with a history of Barrett's esophagus and rectal bleeding in the setting of internal hemorrhoids. Last surveillance for Barrett's in 2014; he was due for surveillance in 2015. Presents today due to recurrent rectal bleeding. Last colonoscopy in 2014 with internal hemorrhoids.   Notes occasional pain with BM, feels burning, almost like a cut. Intermittent. No constipation. No diarrhea. Each morning will vomit. Due for Barrett's surveillance. NO PPI. Has been non-compliant with PPI in the past.   Past Medical History  Diagnosis Date  . Barrett's esophagus   . GERD (gastroesophageal reflux disease)   . Bursitis of elbow     Right  . Bone spur     Past Surgical History  Procedure Laterality Date  . Esophagogastroduodenoscopy  12/23/2010    RMR: short-segment Barrett's  . Colonoscopy    07/09/2007    Friable anal canal, occult fissure not excluded  Otherwise, normal rectum, colon, and terminal ileum  . Hand surgery    . Hemorrhoid surgery    . Thumb surgery    . Shoulder surgery    . Colonoscopy with esophagogastroduodenoscopy (egd) N/A 04/26/2013    Dr. Gala Romney: colonoscopy with internal hemorrhoids otherwise normal. EGD with erosive reflux esophagitis, +Barrett's. Due for Barrett' surveillance 2015.     No current outpatient prescriptions on file.   No current facility-administered medications for this visit.    Allergies as of 11/27/2014  . (No Known Allergies)    Family History  Problem Relation Age of Onset  . Colon cancer Father     History   Social History  . Marital Status: Married    Spouse Name: N/A  . Number of Children: N/A  . Years of Education: N/A   Social History Main Topics  .  Smoking status: Current Every Day Smoker -- 1.00 packs/day for 30 years    Types: Cigarettes  . Smokeless tobacco: Not on file  . Alcohol Use: No  . Drug Use: Yes    Special: Marijuana     Comment: denies use 08/27/13  . Sexual Activity: No   Other Topics Concern  . None   Social History Narrative   ** Merged History Encounter **        Review of Systems: As mentioned in HPI  Physical Exam: BP 120/79 mmHg  Pulse 61  Temp(Src) 98.6 F (37 C)  Ht 5\' 9"  (1.753 m)  Wt 177 lb 9.6 oz (80.559 kg)  BMI 26.22 kg/m2 General:   Alert and oriented. No distress noted. Pleasant and cooperative.  Head:  Normocephalic and atraumatic. Eyes:  Conjuctiva clear without scleral icterus. Mouth:  Oral mucosa pink and moist. Good dentition. No lesions. Heart:  S1, S2 present without murmurs, rubs, or gallops. Regular rate and rhythm. Abdomen:  +BS, soft, non-tender and non-distended. No rebound or guarding. No HSM or masses noted. RECTAL EXAM DECLINED Msk:  Symmetrical without gross deformities. Normal posture. Extremities:  Without edema. Neurologic:  Alert and  oriented x4;  grossly normal neurologically. Skin:  Intact without significant lesions or rashes. Psych:  Alert and cooperative. Normal mood and affect.

## 2014-11-27 NOTE — Patient Instructions (Signed)
Due to your history of Barrett's esophagus, you need to take a reflux medication daily. Take Protonix once each morning, 30 minutes before breakfast.   I have sent in hemorrhoid cream to use twice a day for 7 days. The nitroglycerin cream is to be used twice a day, placing 1 inch inside rectum for 3 weeks. Wear gloves when applying and wash your hands. Monitor for headache or dizziness.   We have scheduled a surveillance EGD with Dr. Gala Romney.  I have also set up an appt for hemorrhoid banding with Dr. Gala Romney in the future.

## 2014-11-28 ENCOUNTER — Ambulatory Visit (HOSPITAL_COMMUNITY)
Admission: RE | Admit: 2014-11-28 | Discharge: 2014-11-28 | Disposition: A | Discharge status: Home / Self Care | Payer: Federal, State, Local not specified - PPO | Source: Ambulatory Visit | Attending: Internal Medicine | Admitting: Internal Medicine

## 2014-11-28 ENCOUNTER — Encounter: Payer: Self-pay | Admitting: Gastroenterology

## 2014-11-28 ENCOUNTER — Encounter (HOSPITAL_COMMUNITY): Payer: Self-pay | Admitting: *Deleted

## 2014-11-28 ENCOUNTER — Encounter (HOSPITAL_COMMUNITY)
Admission: RE | Disposition: A | Discharge status: Home / Self Care | Payer: Self-pay | Source: Ambulatory Visit | Attending: Internal Medicine

## 2014-11-28 DIAGNOSIS — K2270 Barrett's esophagus without dysplasia: Secondary | ICD-10-CM | POA: Insufficient documentation

## 2014-11-28 DIAGNOSIS — K2100 Gastro-esophageal reflux disease with esophagitis, without bleeding: Secondary | ICD-10-CM | POA: Insufficient documentation

## 2014-11-28 DIAGNOSIS — K449 Diaphragmatic hernia without obstruction or gangrene: Secondary | ICD-10-CM | POA: Insufficient documentation

## 2014-11-28 DIAGNOSIS — F17210 Nicotine dependence, cigarettes, uncomplicated: Secondary | ICD-10-CM | POA: Insufficient documentation

## 2014-11-28 HISTORY — PX: ESOPHAGOGASTRODUODENOSCOPY: SHX5428

## 2014-11-28 SURGERY — EGD (ESOPHAGOGASTRODUODENOSCOPY)
Anesthesia: Moderate Sedation

## 2014-11-28 MED ORDER — ONDANSETRON HCL 4 MG/2ML IJ SOLN
INTRAMUSCULAR | Status: AC
  Filled 2014-11-28: qty 2

## 2014-11-28 MED ORDER — MIDAZOLAM HCL 5 MG/5ML IJ SOLN
INTRAMUSCULAR | Status: AC
  Filled 2014-11-28: qty 10

## 2014-11-28 MED ORDER — MEPERIDINE HCL 100 MG/ML IJ SOLN
INTRAMUSCULAR | Status: DC | PRN
  Administered 2014-11-28 (×2): 50 mg via INTRAVENOUS

## 2014-11-28 MED ORDER — MIDAZOLAM HCL 5 MG/5ML IJ SOLN
INTRAMUSCULAR | Status: DC | PRN
  Administered 2014-11-28 (×2): 2 mg via INTRAVENOUS

## 2014-11-28 MED ORDER — LIDOCAINE VISCOUS 2 % MT SOLN
OROMUCOSAL | Status: DC | PRN
  Administered 2014-11-28: 4 mL via OROMUCOSAL

## 2014-11-28 MED ORDER — SODIUM CHLORIDE 0.9 % IV SOLN
INTRAVENOUS | Status: DC
  Administered 2014-11-28: 13:00:00 via INTRAVENOUS

## 2014-11-28 MED ORDER — MEPERIDINE HCL 100 MG/ML IJ SOLN
INTRAMUSCULAR | Status: AC
  Filled 2014-11-28: qty 2

## 2014-11-28 MED ORDER — LIDOCAINE VISCOUS 2 % MT SOLN
OROMUCOSAL | Status: AC
  Filled 2014-11-28: qty 15

## 2014-11-28 NOTE — Assessment & Plan Note (Signed)
39 year old male with history of Barrett's and last EGD in 2014; he is slightly overdue for routine surveillance, as it was slated for 1 year-follow-up. He continues to be non-compliant with PPI dosing, noting vomiting each morning. This is likely GERD-related. No additional upper GI concerning symptoms. Will start on Protonix once daily. I discussed in detail the need for daily PPI indefinitely.   Proceed with upper endoscopy in the near future with Dr. Gala Romney. The risks, benefits, and alternatives have been discussed in detail with patient. They have stated understanding and desire to proceed.  Protonix once daily.

## 2014-11-28 NOTE — Interval H&P Note (Signed)
History and Physical Interval Note:  11/28/2014 2:35 PM  [name] [name]  has presented today for surgery, with the diagnosis of barretts surveillance  The various methods of treatment have been discussed with the patient and family. After consideration of risks, benefits and other options for treatment, the patient has consented to  Procedure(s) with comments: ESOPHAGOGASTRODUODENOSCOPY (EGD) (N/A) - 130  as a surgical intervention .  The patient's history has been reviewed, patient examined, no change in status, stable for surgery.  I have reviewed the patient's chart and labs.  Questions were answered to the patient's satisfaction.     Alexy Heldt   No change. EGD per plan.The risks, benefits, limitations, alternatives and imponderables have been reviewed with the patient. Potential for esophageal dilation, biopsy, etc. have also been reviewed.  Questions have been answered. All parties agreeable.

## 2014-11-28 NOTE — Discharge Instructions (Signed)
EGD Discharge instructions Please read the instructions outlined below and refer to this sheet in the next few weeks. These discharge instructions provide you with general information on caring for yourself after you leave the hospital. Your doctor may also give you specific instructions. While your treatment has been planned according to the most current medical practices available, unavoidable complications occasionally occur. If you have any problems or questions after discharge, please call your doctor. ACTIVITY  You may resume your regular activity but move at a slower pace for the next 24 hours.   Take frequent rest periods for the next 24 hours.   Walking will help expel (get rid of) the air and reduce the bloated feeling in your abdomen.   No driving for 24 hours (because of the anesthesia (medicine) used during the test).   You may shower.   Do not sign any important legal documents or operate any machinery for 24 hours (because of the anesthesia used during the test).  NUTRITION  Drink plenty of fluids.   You may resume your normal diet.   Begin with a light meal and progress to your normal diet.   Avoid alcoholic beverages for 24 hours or as instructed by your caregiver.  MEDICATIONS  You may resume your normal medications unless your caregiver tells you otherwise.  WHAT YOU CAN EXPECT TODAY  You may experience abdominal discomfort such as a feeling of fullness or gas pains.  FOLLOW-UP  Your doctor will discuss the results of your test with you.  SEEK IMMEDIATE MEDICAL ATTENTION IF ANY OF THE FOLLOWING OCCUR:  Excessive nausea (feeling sick to your stomach) and/or vomiting.   Severe abdominal pain and distention (swelling).   Trouble swallowing.   Temperature over 101 F (37.8 C).   Rectal bleeding or vomiting of blood.    Information regarding Barrett's esophagus and GERD provided                                                      Take Protonix  40 mg every day.      Further recommendations to follow pending review of pathology report  Gastroesophageal Reflux Disease, Adult Gastroesophageal reflux disease (GERD) happens when acid from your stomach flows up into the esophagus. When acid comes in contact with the esophagus, the acid causes soreness (inflammation) in the esophagus. Over time, GERD may create small holes (ulcers) in the lining of the esophagus. CAUSES   Increased body weight. This puts pressure on the stomach, making acid rise from the stomach into the esophagus.  Smoking. This increases acid production in the stomach.  Drinking alcohol. This causes decreased pressure in the lower esophageal sphincter (valve or ring of muscle between the esophagus and stomach), allowing acid from the stomach into the esophagus.  Late evening meals and a full stomach. This increases pressure and acid production in the stomach.  A malformed lower esophageal sphincter. Sometimes, no cause is found. SYMPTOMS   Burning pain in the lower part of the mid-chest behind the breastbone and in the mid-stomach area. This may occur twice a week or more often.  Trouble swallowing.  Sore throat.  Dry cough.  Asthma-like symptoms including chest tightness, shortness of breath, or wheezing. DIAGNOSIS  Your caregiver may be able to diagnose GERD based on your symptoms. In some cases, X-rays and  other tests may be done to check for complications or to check the condition of your stomach and esophagus. TREATMENT  Your caregiver may recommend over-the-counter or prescription medicines to help decrease acid production. Ask your caregiver before starting or adding any new medicines.  HOME CARE INSTRUCTIONS   Change the factors that you can control. Ask your caregiver for guidance concerning weight loss, quitting smoking, and alcohol consumption.  Avoid foods and drinks that make your symptoms worse, such as:  Caffeine or alcoholic  drinks.  Chocolate.  Peppermint or mint flavorings.  Garlic and onions.  Spicy foods.  Citrus fruits, such as oranges, lemons, or limes.  Tomato-based foods such as sauce, chili, salsa, and pizza.  Fried and fatty foods.  Avoid lying down for the 3 hours prior to your bedtime or prior to taking a nap.  Eat small, frequent meals instead of large meals.  Wear loose-fitting clothing. Do not wear anything tight around your waist that causes pressure on your stomach.  Raise the head of your bed 6 to 8 inches with wood blocks to help you sleep. Extra pillows will not help.  Only take over-the-counter or prescription medicines for pain, discomfort, or fever as directed by your caregiver.  Do not take aspirin, ibuprofen, or other nonsteroidal anti-inflammatory drugs (NSAIDs). SEEK IMMEDIATE MEDICAL CARE IF:   You have pain in your arms, neck, jaw, teeth, or back.  Your pain increases or changes in intensity or duration.  You develop nausea, vomiting, or sweating (diaphoresis).  You develop shortness of breath, or you faint.  Your vomit is green, yellow, black, or looks like coffee grounds or blood.  Your stool is red, bloody, or black. These symptoms could be signs of other problems, such as heart disease, gastric bleeding, or esophageal bleeding. MAKE SURE YOU:   Understand these instructions.  Will watch your condition.  Will get help right away if you are not doing well or get worse. Document Released: 07/06/2005 Document Revised: 12/19/2011 Document Reviewed: 04/15/2011 [facility] Patient Information 2015 [place], [place]. This information is not intended to replace advice given to you by your health care provider. Make sure you discuss any questions you have with your health care provider.  Barrett's Esophagus Barrett's esophagus occurs when the lining of the esophagus is damaged. The esophagus is the tube that carries food from the mouth to the stomach. With Barrett's  esophagus, the lining of the esophagus gets replaced by material that is similar to the lining in the intestines. This process is called intestinal metaplasia. A small number of people with Barrett's esophagus develop esophageal cancer. CAUSES  The exact cause of Barrett's esophagus is unknown. SYMPTOMS  Most people with Barrett's esophagus do not have symptoms. However, many patients also have gastroesophageal reflux disease (GERD). GERD can cause heartburn, trouble swallowing, and a dry cough. DIAGNOSIS Barrett's esophagus is diagnosed by an exam called upper gastrointestinal endoscopy. A thin, flexible tube (endoscope) is passed down the esophagus. The endoscope has a light and camera on the end. Your caregiver uses the endoscope to view the inside of the esophagus. A tissue sample may also be taken and examined under a microscope (biopsy). If cancer cells are found during the biopsy, this condition is called dysplasia. TREATMENT  If you have no dysplasia or low-grade dysplasia, your caregiver may recommend no treatment or only taking medicines to treat GERD. Sometimes, taking acid-blocking drugs to treat GERD helps improve the tissue affected by Barrett's esophagus. Your caregiver may also recommend periodic  esophageal exams. If you have high-grade dysplasia, treatment may include removing the damaged parts of the esophagus. This can be done by heating, freezing, or surgically removing the tissue. In some cases, surgery may be done to remove most of the esophagus. The stomach is then attached to the remaining portion of the esophagus. HOME CARE INSTRUCTIONS  Take acid-blocking drugs for GERD if recommended by your caregiver.  Keep all follow-up appointments as directed by your caregiver. You may need periodic esophageal exams. SEEK IMMEDIATE MEDICAL CARE IF:  You have chest pain.  You have trouble swallowing.  You vomit blood or material that looks like coffee grounds.  Your stools are  bright red or dark. Document Released: 12/17/2003 Document Revised: 03/27/2012 Document Reviewed: 12/06/2011 [facility] Patient Information 2015 [place], [place]. This information is not intended to replace advice given to you by your health care provider. Make sure you discuss any questions you have with your health care provider.

## 2014-11-28 NOTE — H&P (View-Only) (Signed)
       Referring Provider: Susy Frizzle, MD Primary Care Physician:  Odette Fraction, MD  Primary GI: Dr. Gala Romney   Chief Complaint  Patient presents with  . Rectal Bleeding    when wiping  . Hemorrhoids    HPI:   [name] [name] is a 39 y.o. male presenting today with a history of Barrett's esophagus and rectal bleeding in the setting of internal hemorrhoids. Last surveillance for Barrett's in 2014; he was due for surveillance in 2015. Presents today due to recurrent rectal bleeding. Last colonoscopy in 2014 with internal hemorrhoids.   Notes occasional pain with BM, feels burning, almost like a cut. Intermittent. No constipation. No diarrhea. Each morning will vomit. Due for Barrett's surveillance. NO PPI. Has been non-compliant with PPI in the past.   Past Medical History  Diagnosis Date  . Barrett's esophagus   . GERD (gastroesophageal reflux disease)   . Bursitis of elbow     Right  . Bone spur     Past Surgical History  Procedure Laterality Date  . Esophagogastroduodenoscopy  12/23/2010    RMR: short-segment Barrett's  . Colonoscopy    07/09/2007    Friable anal canal, occult fissure not excluded  Otherwise, normal rectum, colon, and terminal ileum  . Hand surgery    . Hemorrhoid surgery    . Thumb surgery    . Shoulder surgery    . Colonoscopy with esophagogastroduodenoscopy (egd) N/A 04/26/2013    Dr. Gala Romney: colonoscopy with internal hemorrhoids otherwise normal. EGD with erosive reflux esophagitis, +Barrett's. Due for Barrett' surveillance 2015.     No current outpatient prescriptions on file.   No current facility-administered medications for this visit.    Allergies as of 11/27/2014  . (No Known Allergies)    Family History  Problem Relation Age of Onset  . Colon cancer Father     History   Social History  . Marital Status: Married    Spouse Name: N/A  . Number of Children: N/A  . Years of Education: N/A   Social History Main Topics  .  Smoking status: Current Every Day Smoker -- 1.00 packs/day for 30 years    Types: Cigarettes  . Smokeless tobacco: Not on file  . Alcohol Use: No  . Drug Use: Yes    Special: Marijuana     Comment: denies use 08/27/13  . Sexual Activity: No   Other Topics Concern  . None   Social History Narrative   ** Merged History Encounter **        Review of Systems: As mentioned in HPI  Physical Exam: BP 120/79 mmHg  Pulse 61  Temp(Src) 98.6 F (37 C)  Ht 5\' 9"  (1.753 m)  Wt 177 lb 9.6 oz (80.559 kg)  BMI 26.22 kg/m2 General:   Alert and oriented. No distress noted. Pleasant and cooperative.  Head:  Normocephalic and atraumatic. Eyes:  Conjuctiva clear without scleral icterus. Mouth:  Oral mucosa pink and moist. Good dentition. No lesions. Heart:  S1, S2 present without murmurs, rubs, or gallops. Regular rate and rhythm. Abdomen:  +BS, soft, non-tender and non-distended. No rebound or guarding. No HSM or masses noted. RECTAL EXAM DECLINED Msk:  Symmetrical without gross deformities. Normal posture. Extremities:  Without edema. Neurologic:  Alert and  oriented x4;  grossly normal neurologically. Skin:  Intact without significant lesions or rashes. Psych:  Alert and cooperative. Normal mood and affect.

## 2014-11-28 NOTE — Assessment & Plan Note (Signed)
Intermittent low-volume hematochezia in the setting of known internal hemorrhoids. Fairly recent colonoscopy on file from 2014. With symptoms, unable to rule out occult anal fissure as well. HE DECLINED A RECTAL EXAM TODAY but interested in banding in the future. No need for repeat colonoscopy.   Start Anusol BID, alternating with nitroglycerin rectal ointment.  Outpatient hemorrhoid banding appointment to be arranged Patient to call if no improvement in symptoms.

## 2014-11-28 NOTE — Op Note (Signed)
[facility] [address] [place], [address]   ENDOSCOPY PROCEDURE REPORT  PATIENT: [name], [name]  MR#: [id number] BIRTHDATE: 06-Aug-1976 , 38  yrs. old GENDER: male ENDOSCOPIST: R.  Garfield Cornea, MD FACP FACG REFERRED BY:  Jenna Luo, M.D. PROCEDURE DATE:  12/21/14 PROCEDURE:  EGD w/ biopsy INDICATIONS:  History of Barrett's esophagus; breakthrough GERD. MEDICATIONS: Versed 4 mg IV and Demerol 100 mg IV in divided doses. Xylocaine gel orally.  Zofran 4 mg IV. ASA CLASS:      Class II  CONSENT: The risks, benefits, limitations, alternatives and imponderables have been discussed.  The potential for biopsy, esophogeal dilation, etc. have also been reviewed.  Questions have been answered.  All parties agreeable.  Please see the history and physical in the medical record for more information.  DESCRIPTION OF PROCEDURE: After the risks benefits and alternatives of the procedure were thoroughly explained, informed consent was obtained.  The EG-[id number] ([id number]) endoscope was introduced through the mouth and advanced to the second portion of the duodenum , limited by Without limitations. The instrument was slowly withdrawn as the mucosa was fully examined.    3 quadrant distal esophageal erosions.  Abnormal appearing epithelium consistent with prior diagnosis of Barrett's esophagus, coming up about 1-1/2 cm from the GE junction.  Patulous EG junction.  No nodularity.  Stomach empty.  Small hiatal hernia. Normal gastric mucosa.  Patent pylorus.  Normal first and second portion of the duodenum.  The abnormal distal esophagus was biopsied for histologic study. Retroflexed views revealed as previously described.     The scope was then withdrawn from the patient and the procedure completed.  COMPLICATIONS: There were no immediate complications.  ENDOSCOPIC IMPRESSION: Erosive reflux esophagitis. Abnormal distal esophagus consistent with short segment Barrett's  esophagus?"status post biopsy. Hiatal hernia.  RECOMMENDATIONS: Begin Protonix 40 mg daily. Emphasized the importance of taking this medication every day whether he feels like he needs it or not. Follow-up on pathology.  REPEAT EXAM:  eSigned:  R. Garfield Cornea, MD Rosalita [facility]ss[facility] 12/21/2014 2:58 PM    CC:  CPT CODES: ICD CODES:  The ICD and CPT codes recommended by this software are interpretations from the data that the clinical staff has captured with the software.  The verification of the translation of this report to the ICD and CPT codes and modifiers is the sole responsibility of the health care institution and practicing physician where this report was generated.  [employer] will not be held responsible for the validity of the ICD and CPT codes included on this report.  AMA assumes no liability for data contained or not contained herein. CPT is a Designer, television/film set of the [employer].  PATIENT NAME:  [name], [name] MR#: [id number]

## 2014-11-29 NOTE — Progress Notes (Signed)
CC'ED TO PCP 

## 2014-12-01 ENCOUNTER — Telehealth: Payer: Self-pay

## 2014-12-01 ENCOUNTER — Emergency Department (HOSPITAL_COMMUNITY): Payer: Federal, State, Local not specified - PPO

## 2014-12-01 ENCOUNTER — Emergency Department (HOSPITAL_COMMUNITY)
Admission: EM | Admit: 2014-12-01 | Discharge: 2014-12-01 | Disposition: A | Discharge status: Home / Self Care | Payer: Federal, State, Local not specified - PPO | Attending: Emergency Medicine | Admitting: Emergency Medicine

## 2014-12-01 ENCOUNTER — Encounter (HOSPITAL_COMMUNITY): Payer: Self-pay | Admitting: Internal Medicine

## 2014-12-01 DIAGNOSIS — K219 Gastro-esophageal reflux disease without esophagitis: Secondary | ICD-10-CM | POA: Insufficient documentation

## 2014-12-01 DIAGNOSIS — K5900 Constipation, unspecified: Secondary | ICD-10-CM | POA: Diagnosis not present

## 2014-12-01 DIAGNOSIS — R109 Unspecified abdominal pain: Secondary | ICD-10-CM

## 2014-12-01 DIAGNOSIS — Z7952 Long term (current) use of systemic steroids: Secondary | ICD-10-CM | POA: Insufficient documentation

## 2014-12-01 DIAGNOSIS — Z79899 Other long term (current) drug therapy: Secondary | ICD-10-CM | POA: Insufficient documentation

## 2014-12-01 DIAGNOSIS — K2270 Barrett's esophagus without dysplasia: Secondary | ICD-10-CM | POA: Insufficient documentation

## 2014-12-01 DIAGNOSIS — Z8739 Personal history of other diseases of the musculoskeletal system and connective tissue: Secondary | ICD-10-CM | POA: Insufficient documentation

## 2014-12-01 DIAGNOSIS — Z720 Tobacco use: Secondary | ICD-10-CM | POA: Diagnosis not present

## 2014-12-01 LAB — COMPREHENSIVE METABOLIC PANEL
ALBUMIN: 4.5 g/dL (ref 3.5–5.2)
ALT: 28 U/L (ref 0–53)
AST: 20 U/L (ref 0–37)
Alkaline Phosphatase: 48 U/L (ref 39–117)
Anion gap: 3 — ABNORMAL LOW (ref 5–15)
BILIRUBIN TOTAL: 0.5 mg/dL (ref 0.3–1.2)
BUN: 15 mg/dL (ref 6–23)
CO2: 29 mmol/L (ref 19–32)
CREATININE: 1.11 mg/dL (ref 0.50–1.35)
Calcium: 9.3 mg/dL (ref 8.4–10.5)
Chloride: 108 mmol/L (ref 96–112)
GFR calc non Af Amer: 82 mL/min — ABNORMAL LOW (ref >90)
Glucose, Bld: 94 mg/dL (ref 70–99)
Potassium: 4.7 mmol/L (ref 3.5–5.1)
Sodium: 140 mmol/L (ref 135–145)
Total Protein: 7.3 g/dL (ref 6.0–8.3)

## 2014-12-01 LAB — CBC WITH DIFFERENTIAL/PLATELET
BASOS PCT: 0 % (ref 0–1)
Basophils Absolute: 0 10*3/uL (ref 0.0–0.1)
EOS ABS: 0.1 10*3/uL (ref 0.0–0.7)
EOS PCT: 1 % (ref 0–5)
HCT: 46.9 % (ref 39.0–52.0)
Hemoglobin: 16.3 g/dL (ref 13.0–17.0)
LYMPHS ABS: 3.2 10*3/uL (ref 0.7–4.0)
Lymphocytes Relative: 44 % (ref 12–46)
MCH: 31.9 pg (ref 26.0–34.0)
MCHC: 34.8 g/dL (ref 30.0–36.0)
MCV: 91.8 fL (ref 78.0–100.0)
MONO ABS: 0.4 10*3/uL (ref 0.1–1.0)
Monocytes Relative: 6 % (ref 3–12)
Neutro Abs: 3.6 10*3/uL (ref 1.7–7.7)
Neutrophils Relative %: 49 % (ref 43–77)
Platelets: 252 10*3/uL (ref 150–400)
RBC: 5.11 MIL/uL (ref 4.22–5.81)
RDW: 12.8 % (ref 11.5–15.5)
WBC: 7.4 10*3/uL (ref 4.0–10.5)

## 2014-12-01 LAB — LIPASE, BLOOD: Lipase: 30 U/L (ref 11–59)

## 2014-12-01 LAB — URINALYSIS, ROUTINE W REFLEX MICROSCOPIC
BILIRUBIN URINE: NEGATIVE
Glucose, UA: NEGATIVE mg/dL
HGB URINE DIPSTICK: NEGATIVE
Ketones, ur: NEGATIVE mg/dL
Leukocytes, UA: NEGATIVE
Nitrite: NEGATIVE
PH: 7.5 (ref 5.0–8.0)
Protein, ur: NEGATIVE mg/dL
Specific Gravity, Urine: 1.015 (ref 1.005–1.030)
Urobilinogen, UA: 0.2 mg/dL (ref 0.0–1.0)

## 2014-12-01 LAB — LACTIC ACID, PLASMA: Lactic Acid, Venous: 1.1 mmol/L (ref 0.5–2.0)

## 2014-12-01 MED ORDER — FAMOTIDINE IN NACL 20-0.9 MG/50ML-% IV SOLN
20.0000 mg | INTRAVENOUS | Status: AC
  Administered 2014-12-01: 20 mg via INTRAVENOUS
  Filled 2014-12-01: qty 50

## 2014-12-01 MED ORDER — ONDANSETRON HCL 4 MG PO TABS: 4.0000 mg | ORAL_TABLET | Freq: Three times a day (TID) | ORAL | Status: DC | PRN

## 2014-12-01 MED ORDER — ONDANSETRON HCL 4 MG/2ML IJ SOLN
4.0000 mg | Freq: Once | INTRAMUSCULAR | Status: AC
  Administered 2014-12-01: 4 mg via INTRAVENOUS
  Filled 2014-12-01: qty 2

## 2014-12-01 MED ORDER — RANITIDINE HCL 150 MG PO TABS: 150.0000 mg | ORAL_TABLET | Freq: Two times a day (BID) | ORAL | Status: DC

## 2014-12-01 MED ORDER — GI COCKTAIL ~~LOC~~
30.0000 mL | Freq: Once | ORAL | Status: AC
  Administered 2014-12-01: 30 mL via ORAL
  Filled 2014-12-01: qty 30

## 2014-12-01 MED ORDER — POLYETHYLENE GLYCOL 3350 17 GM/SCOOP PO POWD: 17.0000 g | Freq: Two times a day (BID) | ORAL | Status: DC

## 2014-12-01 NOTE — Telephone Encounter (Signed)
Agree with advice given to proceed to ED

## 2014-12-01 NOTE — Telephone Encounter (Signed)
Pt is currently in the ED.

## 2014-12-01 NOTE — ED Notes (Signed)
Pt reports onset of emesis this am at 0200. Now has increasing abd pain.

## 2014-12-01 NOTE — ED Provider Notes (Signed)
CSN: [id number]     Arrival date & time 12/01/14  2202 History   First MD Initiated Contact with Patient 12/01/14 1232     Chief Complaint  Patient presents with  . Abdominal Pain     (Consider location/radiation/quality/duration/timing/severity/associated sxs/prior Treatment) HPI Comments: -The patient is a 39 year old male, he has a history of Barrett's esophagus as well as a history of gastric ulcer in the past. He presents 3 days after undergoing endoscopy for evaluation of his morning nausea and vomiting. This has been going on for some time, gradually worsening. He was started on Protonix, he has taken this medication for the last 2 days but this morning had increased abdominal pain and vomiting. He has vomited multiple times, this morning, has not had any diarrhea constipation or blood in his stools. Nothing makes this better or worse, not associated with fevers chills back pain shortness of breath chest pain headache swelling of the legs or rashes.  Patient is a 38 y.o. male presenting with abdominal pain. The history is provided by the patient.  Abdominal Pain   Past Medical History  Diagnosis Date  . Barrett's esophagus   . GERD (gastroesophageal reflux disease)   . Bursitis of elbow     Right  . Bone spur    Past Surgical History  Procedure Laterality Date  . Esophagogastroduodenoscopy  12/23/2010    RMR: short-segment Barrett's  . Colonoscopy    07/09/2007    Friable anal canal, occult fissure not excluded  Otherwise, normal rectum, colon, and terminal ileum  . Hand surgery    . Hemorrhoid surgery    . Thumb surgery    . Shoulder surgery    . Colonoscopy with esophagogastroduodenoscopy (egd) N/A 04/26/2013    Dr. Gala Romney: colonoscopy with internal hemorrhoids otherwise normal. EGD with erosive reflux esophagitis, +Barrett's. Due for Barrett' surveillance 2015.   Marland Kitchen Esophagogastroduodenoscopy N/A 11/28/2014    Procedure: ESOPHAGOGASTRODUODENOSCOPY (EGD);  Surgeon: Daneil Dolin, MD;  Location: [facility];  Service: Endoscopy;  Laterality: N/A;  130    Family History  Problem Relation Age of Onset  . Colon cancer Father    History  Substance Use Topics  . Smoking status: Current Every Day Smoker -- 1.00 packs/day for 30 years    Types: Cigarettes  . Smokeless tobacco: Former Systems developer  . Alcohol Use: No    Review of Systems  Gastrointestinal: Positive for abdominal pain.  All other systems reviewed and are negative.     Allergies  Review of patient's allergies indicates no known allergies.  Home Medications   Prior to Admission medications   Medication Sig Start Date End Date Taking? Authorizing Provider  pantoprazole (PROTONIX) 40 MG tablet Take 1 tablet (40 mg total) by mouth daily. Take 30 minutes before breakfast 11/27/14  Yes Orvil Feil, NP  hydrocortisone (ANUSOL-HC) 2.5 % rectal cream Place 1 application rectally 2 (two) times daily. 11/27/14   Orvil Feil, NP  Nitroglycerin 0.4 % OINT Place 1 inch rectally 2 (two) times daily. 11/27/14   Orvil Feil, NP  ondansetron (ZOFRAN) 4 MG tablet Take 1 tablet (4 mg total) by mouth every 8 (eight) hours as needed for nausea or vomiting. 12/01/14   Johnna Acosta, MD  polyethylene glycol powder (GLYCOLAX/MIRALAX) powder Take 17 g by mouth 2 (two) times daily. Until daily soft stools  OTC 12/01/14   Johnna Acosta, MD  ranitidine (ZANTAC) 150 MG tablet Take 1 tablet (150 mg total) by  mouth 2 (two) times daily. 12/01/14   Johnna Acosta, MD   BP 118/85 mmHg  Pulse 52  Temp(Src) 98.1 F (36.7 C) (Oral)  Resp 16  Ht 5\' 9"  (1.753 m)  Wt 177 lb (80.287 kg)  BMI 26.13 kg/m2  SpO2 98% Physical Exam  Constitutional: He appears well-developed and well-nourished. No distress.  HENT:  Head: Normocephalic and atraumatic.  Mouth/Throat: Oropharynx is clear and moist. No oropharyngeal exudate.  Eyes: Conjunctivae and EOM are normal. Pupils are equal, round, and reactive to light. Right eye exhibits no  discharge. Left eye exhibits no discharge. No scleral icterus.  Neck: Normal range of motion. Neck supple. No JVD present. No thyromegaly present.  Cardiovascular: Normal rate, regular rhythm, normal heart sounds and intact distal pulses.  Exam reveals no gallop and no friction rub.   No murmur heard. Pulmonary/Chest: Effort normal and breath sounds normal. No respiratory distress. He has no wheezes. He has no rales.  Abdominal: Soft. Bowel sounds are normal. He exhibits no distension and no mass. There is tenderness (mild tenderness to palpation in the epigastrium without guarding or masses, no peritoneal signs).  Musculoskeletal: Normal range of motion. He exhibits no edema or tenderness.  Lymphadenopathy:    He has no cervical adenopathy.  Neurological: He is alert. Coordination normal.  Skin: Skin is warm and dry. No rash noted. No erythema.  Psychiatric: He has a normal mood and affect. His behavior is normal.  Nursing note and vitals reviewed.   ED Course  Procedures (including critical care time) Labs Review Labs Reviewed  COMPREHENSIVE METABOLIC PANEL - Abnormal; Notable for the following:    GFR calc non Af Amer 82 (*)    Anion gap 3 (*)    All other components within normal limits  CBC WITH DIFFERENTIAL/PLATELET  LACTIC ACID, PLASMA  LIPASE, BLOOD  URINALYSIS, ROUTINE W REFLEX MICROSCOPIC    Imaging Review Dg Abd Acute W/chest  12/01/2014   CLINICAL DATA:  Abdominal pain with vomiting for 1 day  EXAM: ACUTE ABDOMEN SERIES (ABDOMEN 2 VIEW & CHEST 1 VIEW)  COMPARISON:  Chest CT January 11, 2008; supine and upright abdomen August 05, 2010  FINDINGS: PA chest: Lungs are clear. Heart size and pulmonary vascularity are normal. No adenopathy.  Supine and upright abdomen: There is fairly diffuse stool throughout colon. There is no bowel dilatation or air-fluid level suggesting obstruction. No free air. No abnormal calcifications are identified.  IMPRESSION: Fairly diffuse stool  throughout colon. Overall bowel gas pattern unremarkable. No lung edema or consolidation.   Electronically Signed   By: Lowella Grip III M.D.   On: 12/01/2014 14:29      MDM   Final diagnoses:  Abdominal pain  Constipation, unspecified constipation type    Vital signs are normal, blood work unremarkable, abdominal exam consistent with gastritis, doubt perforation of bowel or viscus, check chest x-ray and abdominal x-ray to rule out free air or other complication. The patient appears comfortable, I anticipate starting Zantac or Pepcid, antiemetic, follow-up with gastroenterology if imaging unremarkable.  Xray shows constipation - labs unremarkable, pt informed of all results - appears stable for d/c.  Meds given in ED:  Medications  gi cocktail (Maalox,Lidocaine,Donnatal) (30 mLs Oral Given 12/01/14 1249)  famotidine (PEPCID) IVPB 20 mg (0 mg Intravenous Stopped 12/01/14 1317)  ondansetron (ZOFRAN) injection 4 mg (4 mg Intravenous Given 12/01/14 1247)    New Prescriptions   ONDANSETRON (ZOFRAN) 4 MG TABLET    Take 1 tablet (  4 mg total) by mouth every 8 (eight) hours as needed for nausea or vomiting.   POLYETHYLENE GLYCOL POWDER (GLYCOLAX/MIRALAX) POWDER    Take 17 g by mouth 2 (two) times daily. Until daily soft stools  OTC   RANITIDINE (ZANTAC) 150 MG TABLET    Take 1 tablet (150 mg total) by mouth 2 (two) times daily.      Johnna Acosta, MD 12/01/14 [phone]

## 2014-12-01 NOTE — Discharge Instructions (Signed)

## 2014-12-01 NOTE — Telephone Encounter (Signed)
Pt called- he stated he started his protonix on Saturday, he said he felt a little funny on Sunday, and he woke up this morning and has been vomiting since 4am. He also has severe abd pain, which he says is at the point he cant take it anymore. He said his pain level is at a 6 right now and getting worse. He has tried nexium and prilosec in the past and both have caused vomiting. He said he feels like he is going to pass out. He is not sure if he has a fever or not. I advised pt that he should go to the ED for severe pain and feeling like he is going to pass out. Pt verbalized understanding and said he was going to call his wife and have her come get him and take him to the ED.   pts cell number is [phone]

## 2014-12-04 ENCOUNTER — Encounter: Payer: Self-pay | Admitting: Internal Medicine

## 2014-12-26 ENCOUNTER — Emergency Department (HOSPITAL_COMMUNITY): Payer: Federal, State, Local not specified - PPO

## 2014-12-26 ENCOUNTER — Emergency Department (HOSPITAL_COMMUNITY)
Admission: EM | Admit: 2014-12-26 | Discharge: 2014-12-26 | Disposition: A | Discharge status: Home / Self Care | Payer: Federal, State, Local not specified - PPO | Attending: Emergency Medicine | Admitting: Emergency Medicine

## 2014-12-26 ENCOUNTER — Encounter (HOSPITAL_COMMUNITY): Payer: Self-pay | Admitting: *Deleted

## 2014-12-26 DIAGNOSIS — Y9289 Other specified places as the place of occurrence of the external cause: Secondary | ICD-10-CM | POA: Diagnosis not present

## 2014-12-26 DIAGNOSIS — K219 Gastro-esophageal reflux disease without esophagitis: Secondary | ICD-10-CM | POA: Insufficient documentation

## 2014-12-26 DIAGNOSIS — Z79899 Other long term (current) drug therapy: Secondary | ICD-10-CM | POA: Diagnosis not present

## 2014-12-26 DIAGNOSIS — Y9389 Activity, other specified: Secondary | ICD-10-CM | POA: Insufficient documentation

## 2014-12-26 DIAGNOSIS — Z720 Tobacco use: Secondary | ICD-10-CM | POA: Insufficient documentation

## 2014-12-26 DIAGNOSIS — S61011A Laceration without foreign body of right thumb without damage to nail, initial encounter: Secondary | ICD-10-CM | POA: Diagnosis not present

## 2014-12-26 DIAGNOSIS — Z8739 Personal history of other diseases of the musculoskeletal system and connective tissue: Secondary | ICD-10-CM | POA: Insufficient documentation

## 2014-12-26 DIAGNOSIS — Z7952 Long term (current) use of systemic steroids: Secondary | ICD-10-CM | POA: Diagnosis not present

## 2014-12-26 DIAGNOSIS — Y288XXA Contact with other sharp object, undetermined intent, initial encounter: Secondary | ICD-10-CM | POA: Insufficient documentation

## 2014-12-26 DIAGNOSIS — Y998 Other external cause status: Secondary | ICD-10-CM | POA: Diagnosis not present

## 2014-12-26 IMAGING — DX DG HAND COMPLETE 3+V*R*
3 series · 3 of 3 positions shown · non-contrast
Comparison: RIGHT middle finger radiographs [DATE]

CLINICAL DATA: Hit thumb with post hole driver, laceration

EXAM:
RIGHT HAND - COMPLETE 3+ VIEW

[hand pa]
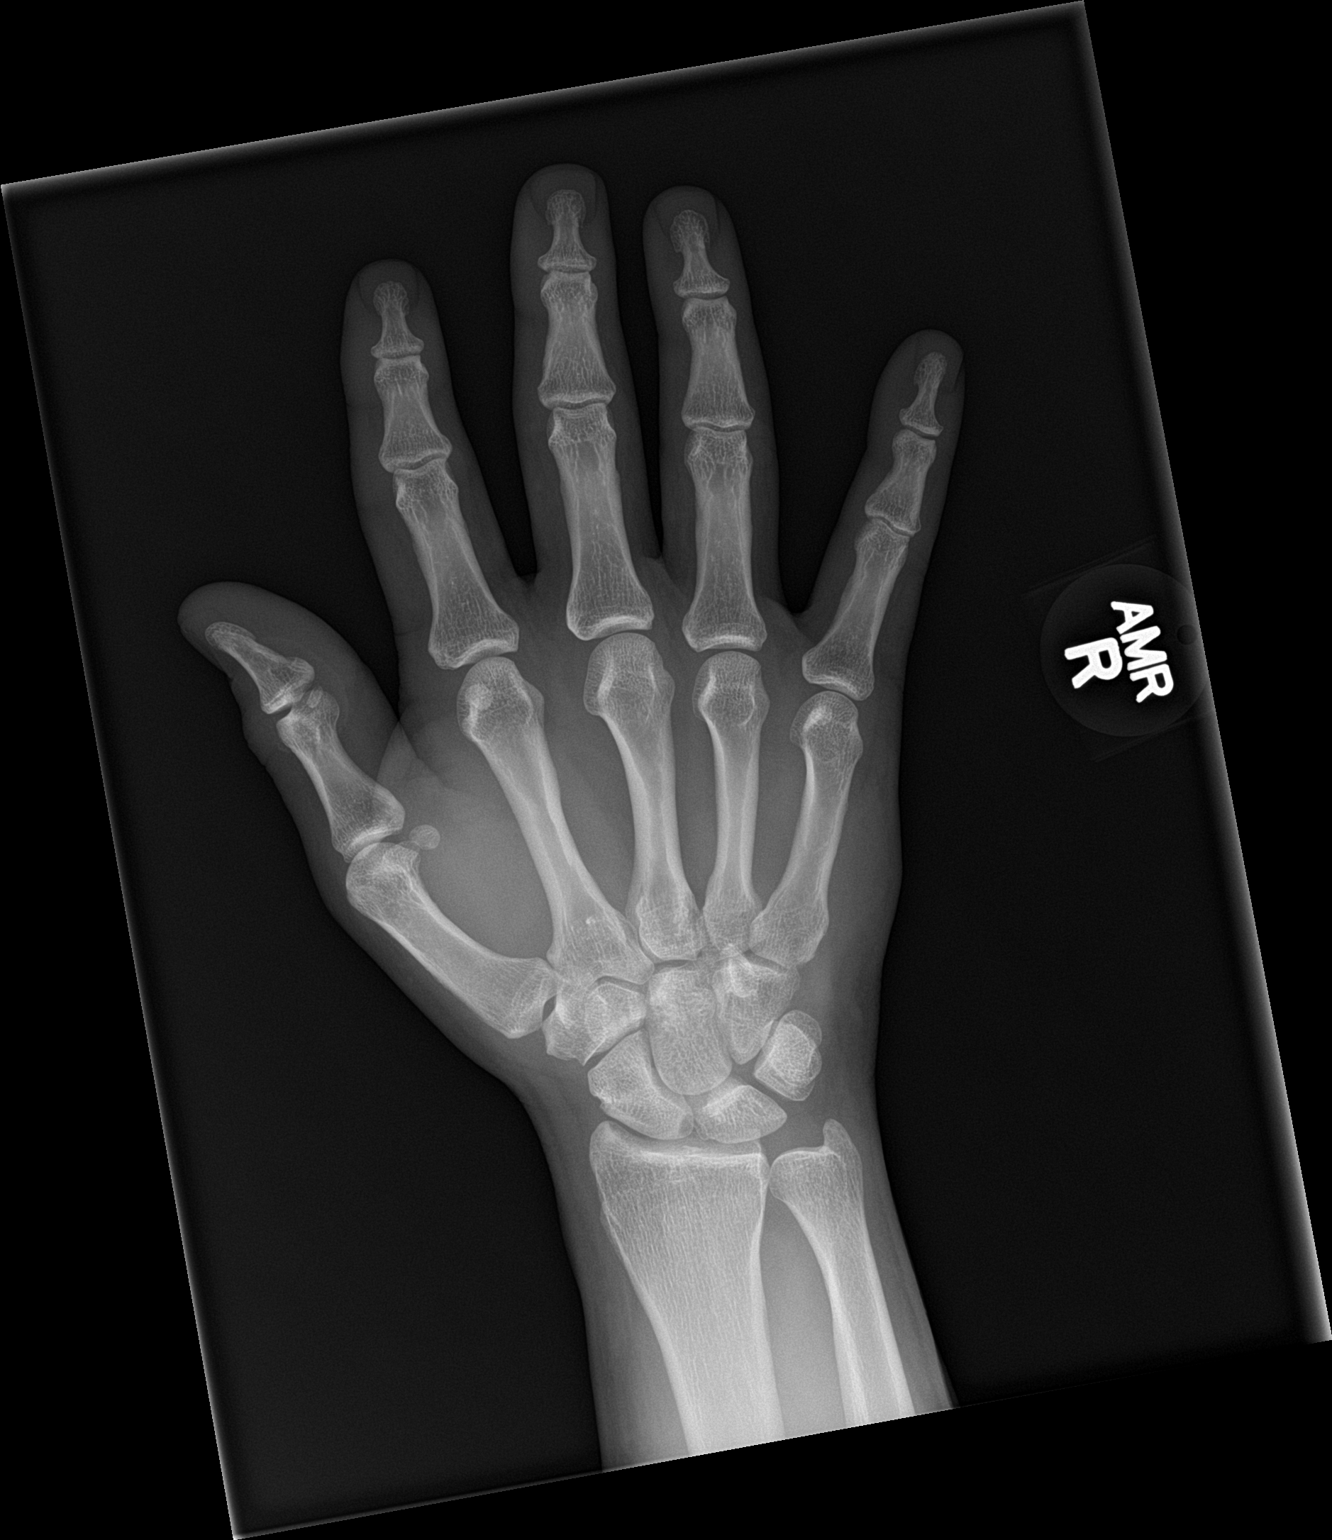

[hand obl]
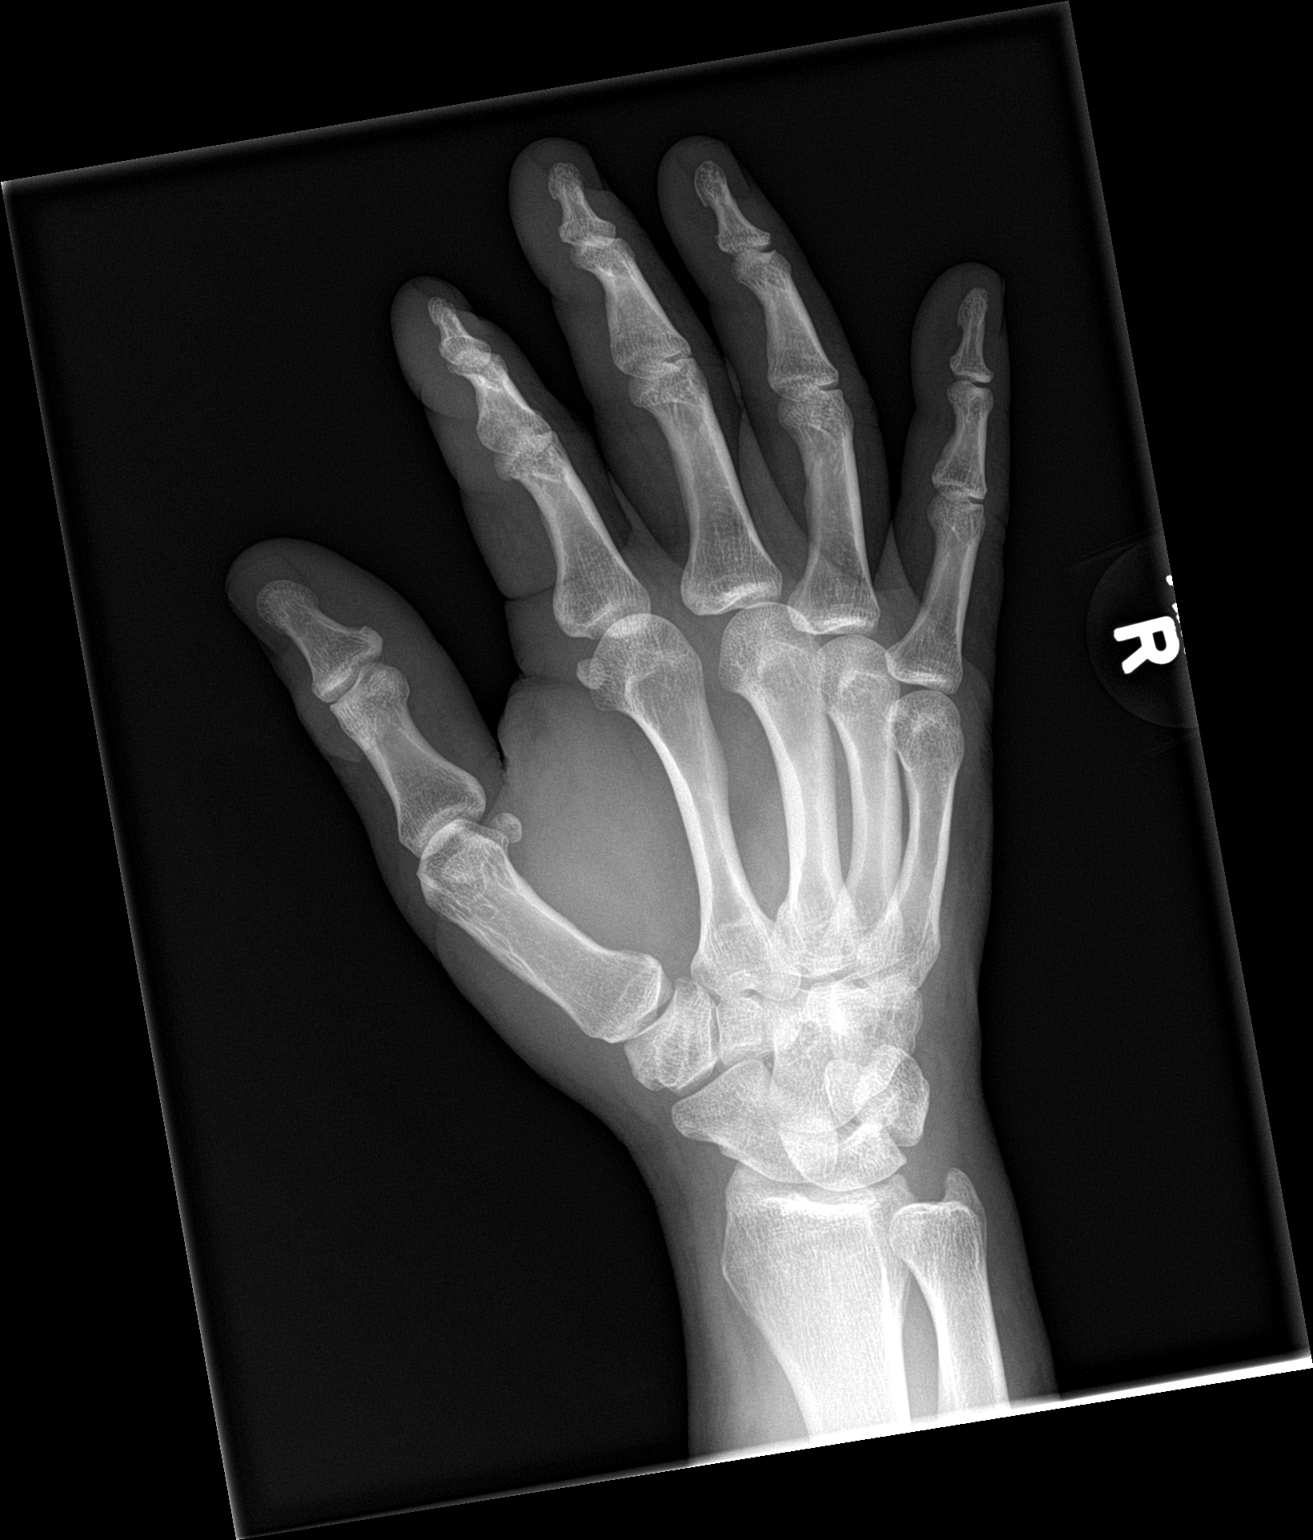

[hand lat]
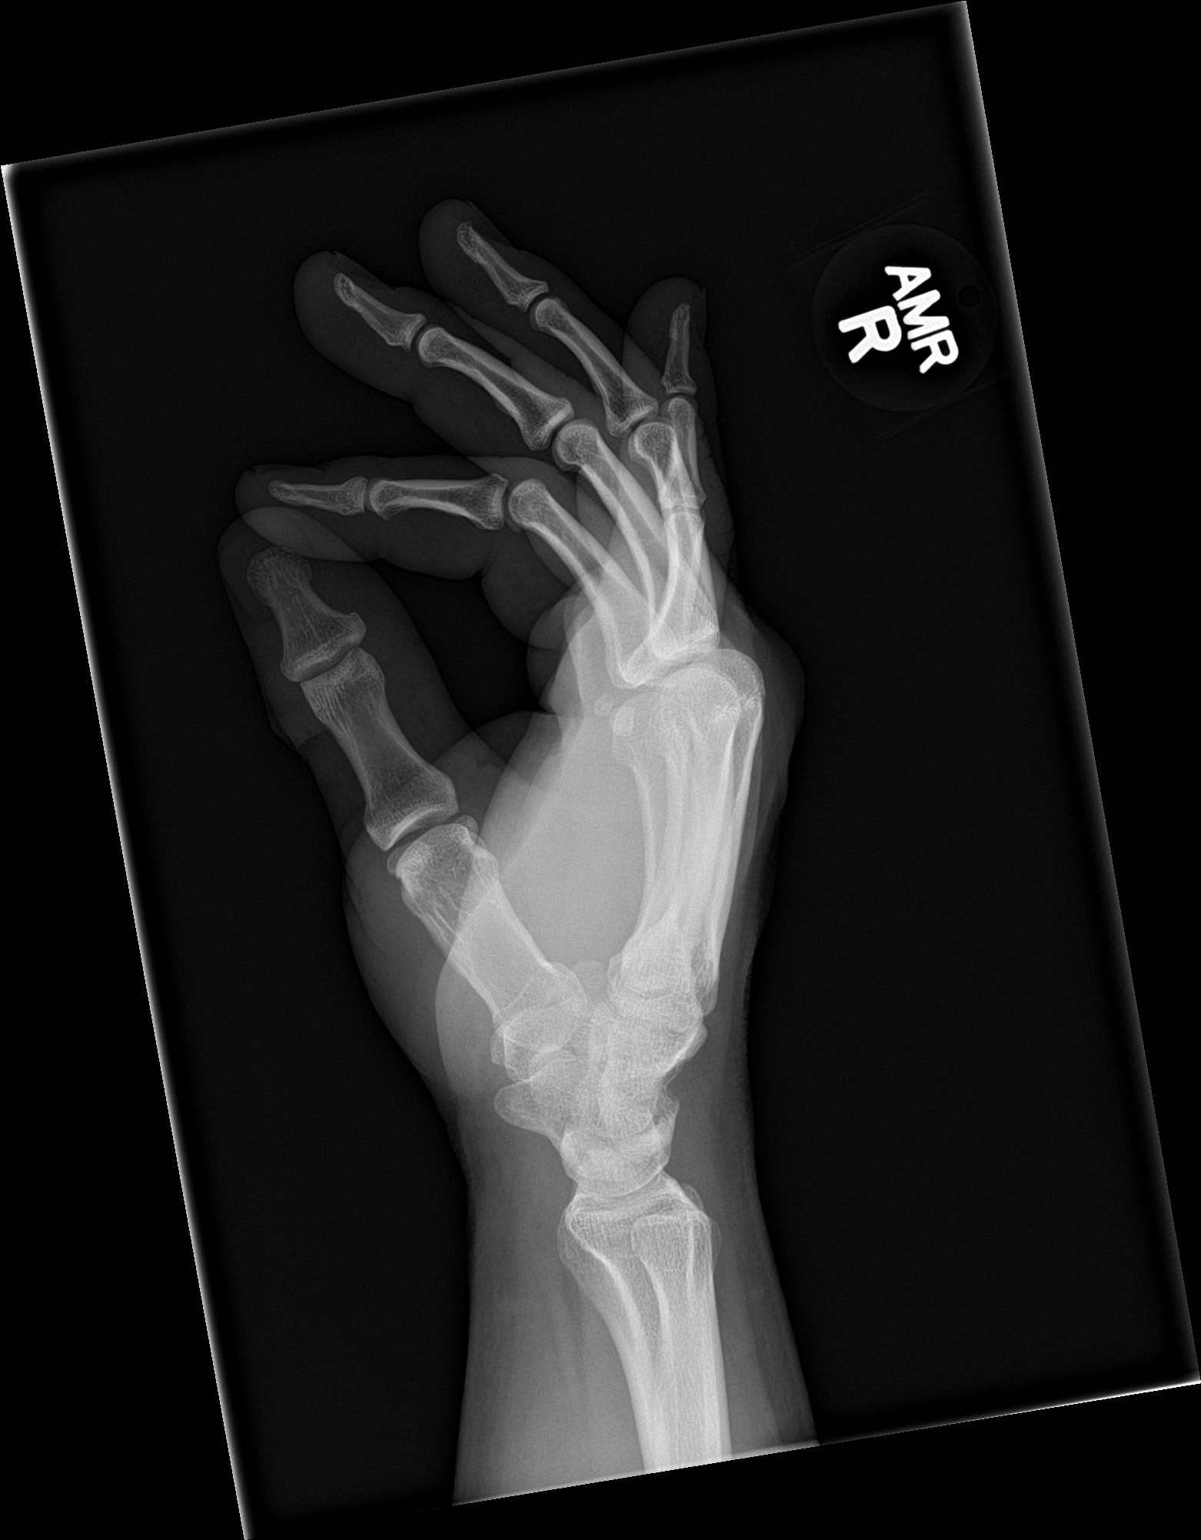

[3 of 3 positions shown; findings below may reference images not displayed]

FINDINGS: Soft tissue deformity/ injury 2 midportion of RIGHT thumb at dorsal
radial margin.

Osseous mineralization normal.

Joint spaces preserved.

No acute fracture dislocation.

Small well-formed erosion at the base of the lunate unchanged since
[XT].
IMPRESSION: No acute osseous abnormalities.

## 2014-12-26 MED ORDER — HYDROCODONE-ACETAMINOPHEN 5-325 MG PO TABS
1.0000 | ORAL_TABLET | Freq: Once | ORAL | Status: AC
  Administered 2014-12-26: 1 via ORAL
  Filled 2014-12-26: qty 1

## 2014-12-26 MED ORDER — LIDOCAINE HCL (PF) 1 % IJ SOLN
INTRAMUSCULAR | Status: AC
  Filled 2014-12-26: qty 5

## 2014-12-26 MED ORDER — HYDROCODONE-ACETAMINOPHEN 5-325 MG PO TABS: 1.0000 | ORAL_TABLET | ORAL | Status: DC | PRN

## 2014-12-26 MED ORDER — CEPHALEXIN 500 MG PO CAPS: 500.0000 mg | ORAL_CAPSULE | Freq: Four times a day (QID) | ORAL | Status: DC

## 2014-12-26 MED ORDER — HYDROCODONE-ACETAMINOPHEN 5-325 MG PO TABS: 1.0000 | ORAL_TABLET | Freq: Once | ORAL | Status: DC

## 2014-12-26 NOTE — Discharge Instructions (Signed)
Elevate the area, apply ice, wear the splint for comfort and return in 2 days for recheck. Do not drive while taking the narcotic as it will make you sleepy.

## 2014-12-26 NOTE — ED Provider Notes (Signed)
CSN: [id number]     Arrival date & time 12/26/14  1406 History   First MD Initiated Contact with Patient 12/26/14 1547     Chief Complaint  Patient presents with  . Extremity Laceration     (Consider location/radiation/quality/duration/timing/severity/associated sxs/prior Treatment) Patient is a 39 y.o. male presenting with skin laceration.  Laceration Location:  Finger Finger laceration location:  R thumb Depth:  Through underlying tissue Quality: jagged   Bleeding: controlled with pressure   Time since incident: just prior to arrival. Laceration mechanism:  Metal edge Pain details:    Severity:  Moderate Foreign body present:  Unable to specify Relieved by:  Nothing Worsened by:  Movement  [name] [name] is a 39 y.o. male who presents to the ED with a laceration right hand. He was working and cut the base of his thumb palm side on a galvanized post.   Past Medical History  Diagnosis Date  . Barrett's esophagus   . GERD (gastroesophageal reflux disease)   . Bursitis of elbow     Right  . Bone spur    Past Surgical History  Procedure Laterality Date  . Esophagogastroduodenoscopy  12/23/2010    RMR: short-segment Barrett's  . Colonoscopy    07/09/2007    Friable anal canal, occult fissure not excluded  Otherwise, normal rectum, colon, and terminal ileum  . Hand surgery    . Hemorrhoid surgery    . Thumb surgery    . Shoulder surgery    . Colonoscopy with esophagogastroduodenoscopy (egd) N/A 04/26/2013    Dr. Gala Romney: colonoscopy with internal hemorrhoids otherwise normal. EGD with erosive reflux esophagitis, +Barrett's. Due for Barrett' surveillance 2015.   Marland Kitchen Esophagogastroduodenoscopy N/A 11/28/2014    Procedure: ESOPHAGOGASTRODUODENOSCOPY (EGD);  Surgeon: Daneil Dolin, MD;  Location: [facility];  Service: Endoscopy;  Laterality: N/A;  130    Family History  Problem Relation Age of Onset  . Colon cancer Father    History  Substance Use Topics  . Smoking  status: Current Every Day Smoker -- 1.00 packs/day for 30 years    Types: Cigarettes  . Smokeless tobacco: Former Systems developer  . Alcohol Use: No    Review of Systems Negative except as stated in HPI   Allergies  Review of patient's allergies indicates no known allergies.  Home Medications   Prior to Admission medications   Medication Sig Start Date End Date Taking? Authorizing Provider  ondansetron (ZOFRAN) 4 MG tablet Take 1 tablet (4 mg total) by mouth every 8 (eight) hours as needed for nausea or vomiting. 12/01/14  Yes Noemi Chapel, MD  cephALEXin (KEFLEX) 500 MG capsule Take 1 capsule (500 mg total) by mouth 4 (four) times daily. 12/26/14   Hope Bunnie Pion, NP  HYDROcodone-acetaminophen (NORCO/VICODIN) 5-325 MG per tablet Take 1 tablet by mouth every 4 (four) hours as needed. 12/26/14   Hope Bunnie Pion, NP  hydrocortisone (ANUSOL-HC) 2.5 % rectal cream Place 1 application rectally 2 (two) times daily. Patient not taking: Reported on 12/26/2014 11/27/14   Orvil Feil, NP  Nitroglycerin 0.4 % OINT Place 1 inch rectally 2 (two) times daily. Patient not taking: Reported on 12/26/2014 11/27/14   Orvil Feil, NP  pantoprazole (PROTONIX) 40 MG tablet Take 1 tablet (40 mg total) by mouth daily. Take 30 minutes before breakfast Patient not taking: Reported on 12/26/2014 11/27/14   Orvil Feil, NP  polyethylene glycol powder (GLYCOLAX/MIRALAX) powder Take 17 g by mouth 2 (two) times daily. Until daily  soft stools  OTC Patient not taking: Reported on 12/26/2014 12/01/14   Noemi Chapel, MD  ranitidine (ZANTAC) 150 MG tablet Take 1 tablet (150 mg total) by mouth 2 (two) times daily. Patient not taking: Reported on 12/26/2014 12/01/14   Noemi Chapel, MD   BP 140/72 mmHg  Pulse 99  Temp(Src) 98 F (36.7 C) (Oral)  Resp 18  Ht 5\' 10"  (1.778 m)  Wt 178 lb (80.74 kg)  BMI 25.54 kg/m2  SpO2 100% Physical Exam  Constitutional: He is oriented to person, place, and time. He appears well-developed and well-nourished.  No distress.  HENT:  Head: Normocephalic.  Eyes: EOM are normal.  Neck: Neck supple.  Cardiovascular: Normal rate.   Pulmonary/Chest: Effort normal.  Musculoskeletal:       Right hand: He exhibits tenderness and laceration. He exhibits normal range of motion and normal capillary refill. Normal sensation noted. Normal strength noted.       Hands: There is a jagged wound to the right thumb at the DIP on the palmar aspect. There is a superficial wound to the dorsum of the thumb.   Neurological: He is alert and oriented to person, place, and time. No cranial nerve deficit.  Skin: Skin is warm and dry.  Psychiatric: He has a normal mood and affect. His behavior is normal.  Nursing note and vitals reviewed.   ED Course  Procedures (including critical care time) Area soaked in NSS and betadine, x-ray  Tetanus is up to date Labs Review Labs Reviewed - No data to display  Imaging Review Dg Hand Complete Right  12/26/2014   CLINICAL DATA:  Hit thumb with post hole driver, laceration  EXAM: RIGHT HAND - COMPLETE 3+ VIEW  COMPARISON:  RIGHT middle finger radiographs 12/27/2007  FINDINGS: Soft tissue deformity/ injury 2 midportion of RIGHT thumb at dorsal radial margin.  Osseous mineralization normal.  Joint spaces preserved.  No acute fracture dislocation.  Small well-formed erosion at the base of the lunate unchanged since 2009.  IMPRESSION: No acute osseous abnormalities.   Electronically Signed   By: Lavonia Dana M.D.   On: 12/26/2014 16:23   LACERATION REPAIR Performed by: Debroah Baller Authorized by: NEESE,HOPE Consent: Verbal consent obtained. Risks and benefits: risks, benefits and alternatives were discussed Consent given by: patient Patient identity confirmed: provided demographic data Prepped and Draped in normal sterile fashion Wound explored  Laceration Location: right thumb  Laceration Length: 2 cm  No Foreign Bodies seen or palpated  Anesthesia: local infiltration  Local  anesthetic: lidocaine 1% without epinephrine  Anesthetic total: 4 ml  Irrigation method: syringe Amount of cleaning: standard  Skin closure: 5-0 prolene  Number of sutures: 7  Technique: interrupted   Patient tolerance: Patient tolerated the procedure well with no immediate complications.  Pressure dressing and splint applied.    MDM  39 y.o. male with laceration to the right thumb s/p injury. Stable for d/c without neurovascular compromise. Will start antibiotics and pain management. He will return in 2 days for wound check or sooner for problems. Discussed with the patient and all questioned fully answered.   Final diagnoses:  Thumb laceration, right, initial encounter       [facility], NP 12/26/14 1744  Dorie Rank, MD 12/27/14 [phone]

## 2014-12-26 NOTE — ED Notes (Signed)
NP at bedside suturing pt at this time.

## 2014-12-26 NOTE — ED Notes (Signed)
Laceration to palmar surface DIP thumb from galvanized post.

## 2014-12-30 ENCOUNTER — Ambulatory Visit (INDEPENDENT_AMBULATORY_CARE_PROVIDER_SITE_OTHER): Payer: Federal, State, Local not specified - PPO | Admitting: Internal Medicine

## 2014-12-30 ENCOUNTER — Encounter: Payer: Self-pay | Admitting: Internal Medicine

## 2014-12-30 VITALS — BP 117/78 | HR 54 | Temp 97.3°F | Ht 68.0 in | Wt 176.6 lb

## 2014-12-30 DIAGNOSIS — K648 Other hemorrhoids: Secondary | ICD-10-CM

## 2014-12-30 NOTE — Progress Notes (Signed)
[place] banding procedure note:  The patient presents with symptomatic grade 1-2 hemorrhoids unresponsive to maximal medical therapy requesting rubber band ligation of his hemorrhoidal disease. All risks, benefits, and alternative forms of therapy were described and informed consent was obtained.  I explained band ligation of hemorrhoids cannot be guaranteed to alleviate all of his hemorrhoidal symptoms with 100% effectiveness.  In the left lateral decubitus position a DRE was performed.This was a normal examination aside from a somewhat tight sphincter tone.. A pea-sized amount of nitroglycerin 0.125% and Xylocaine jelly 1% was applied to the anorectum. The decision was made to band the left lateral internal hemorrhoid and the [place] was used to perform band ligation without complication.   Digital anorectal examination was then performed to assure proper positioning of the band;  Band found to be in excellent position with no pinching or pain after deployment; The patient was discharged home without pain or other issues. Dietary and behavioral recommendations were given. The patient will return in 3 weeks for followup and possible additional banding as required.  No complications were encountered and the patient tolerated the procedure well.

## 2014-12-30 NOTE — Patient Instructions (Signed)
Avoid straining.  Continue fiber bars.  Limit toilet time to 2-3 minutes  Call with any interim problems  Schedule followup appointment in 3 weeks from now

## 2015-02-13 ENCOUNTER — Ambulatory Visit: Payer: Federal, State, Local not specified - PPO | Admitting: Internal Medicine

## 2015-03-24 ENCOUNTER — Encounter: Payer: Self-pay | Admitting: Internal Medicine

## 2015-05-01 ENCOUNTER — Ambulatory Visit (INDEPENDENT_AMBULATORY_CARE_PROVIDER_SITE_OTHER): Payer: Federal, State, Local not specified - PPO | Admitting: Internal Medicine

## 2015-05-01 ENCOUNTER — Encounter: Payer: Self-pay | Admitting: Internal Medicine

## 2015-05-01 VITALS — BP 131/81 | HR 56 | Temp 97.5°F | Ht 70.0 in | Wt 176.4 lb

## 2015-05-01 DIAGNOSIS — K648 Other hemorrhoids: Secondary | ICD-10-CM | POA: Diagnosis not present

## 2015-05-01 NOTE — Patient Instructions (Signed)
Avoid straining.  Benefiber 2 teaspoons twice daily  Limit toilet time to 2-3 minutes  Call with any interim problems  Schedule followup appointment in 3-4 weeks from now

## 2015-05-01 NOTE — Progress Notes (Signed)
[place] banding procedure note:  The patient presents with symptomatic grade 1-2 hemorrhoids;  Status post banding of left lateral column previously. Bleeding episodes much less frequent. He is interested in surgical banding today. All risks, benefits, and alternative forms of therapy were described and informed consent was obtained.  In the left lateral decubitus position, a digital rectal exam revealed no abnormalities.  Patient was slightly tight sphincter tone.  The decision was made to band the right anterior internal hemorrhoid;  A pea-sized amount of nitroglycerin 0.125% and Xylocaine 1% instilled in the anal canal via applicator. [place] was used to perform band ligation without complication. Digital anorectal examination was then performed to assure proper positioning of the band;  Band felt to be in excellent position. No pinching or pain.   The patient was discharged home without pain or other issues. Dietary and behavioral recommendations were given.  Office follow-up with [place] in 3-4 weeks for possible additional banding as required.  No complications were encountered and the patient tolerated the procedure well.

## 2015-06-26 ENCOUNTER — Ambulatory Visit (INDEPENDENT_AMBULATORY_CARE_PROVIDER_SITE_OTHER): Payer: Federal, State, Local not specified - PPO | Admitting: Internal Medicine

## 2015-06-26 ENCOUNTER — Encounter: Payer: Self-pay | Admitting: Internal Medicine

## 2015-06-26 VITALS — BP 126/83 | HR 48 | Temp 97.9°F | Ht 68.0 in | Wt 177.8 lb

## 2015-06-26 DIAGNOSIS — K648 Other hemorrhoids: Secondary | ICD-10-CM | POA: Diagnosis not present

## 2015-06-26 NOTE — Patient Instructions (Signed)
Avoid straining.  Benefiber 2 teaspoons twice daily  Limit toilet time to 2-3 minutes  Call with any interim problems  Schedule followup appointment in 2 months from now

## 2015-06-26 NOTE — Progress Notes (Signed)
[place] banding procedure note:  The patient presents with symptomatic grade 2/3  Hemorrhoids, - status post banding of the right anterior and left lateral column previously. Patient states, globally, his hemorrhoids have improved. Much less bleeding he strained once and had a little blood but overall much better wants the third band today. All risks, benefits, and alternative forms of therapy were described and informed consent was obtained. I discussed the risk and benefits. No 100% guarantee that all his hemorrhoid symptoms would resolve even after a third band placed. Patient understands. In the left lateral decubitus position, a digital rectal exam was performed. Normal. A small amount of 0.125% nitroglycerin and 1% Xylocaine was instilled into the anal canal.  The decision was made to band the right posterior internal hemorrhoid; the [place] was used to perform band ligation without complication. Digital anorectal examination was then performed to assure proper positioning of the band; been felt to be in excellent position. No tension or pain. The patient was discharged home without pain or other issues. Dietary and behavioral recommendations were given  along with follow-up instructions. The patient will return in 2 months for followup  No complications were encountered and the patient tolerated the procedure well.

## 2015-06-30 ENCOUNTER — Encounter: Payer: Self-pay | Admitting: Internal Medicine

## 2015-07-22 ENCOUNTER — Encounter: Payer: Self-pay | Admitting: Gastroenterology

## 2015-07-22 ENCOUNTER — Telehealth: Payer: Self-pay | Admitting: Internal Medicine

## 2015-07-22 MED ORDER — HYDROCORTISONE 2.5 % RE CREA: 1.0000 "application " | TOPICAL_CREAM | Freq: Two times a day (BID) | RECTAL | Status: DC

## 2015-07-22 MED ORDER — NITROGLYCERIN 0.4 % RE OINT: 1.0000 [in_us] | TOPICAL_OINTMENT | Freq: Two times a day (BID) | RECTAL | Status: DC

## 2015-07-22 NOTE — Telephone Encounter (Signed)
Pt is aware.  

## 2015-07-22 NOTE — Telephone Encounter (Signed)
Spoke with the pt- he saw some spots of blood yesterday. When he woke up this morning he had a bm and saw a lot of bright red blood. He said he is still bleeding. He is having lower abd cramping that comes and goes. No constipation, no straining. He said his bm is the consistency of peanut butter. He said it is very painful to have a bm, it feels like razor blades when it is coming out. Pt is extremely concerned and worried.

## 2015-07-22 NOTE — Addendum Note (Signed)
Addended by: Orvil Feil on: 07/22/2015 09:52 AM   Modules accepted: Orders

## 2015-07-22 NOTE — Telephone Encounter (Signed)
Tried to call pt- NA- LMOM 

## 2015-07-22 NOTE — Telephone Encounter (Signed)
I sent in the anusol and the nitro cream as noted below. If he is having severe abdominal pain and worsening rectal bleeding, go to ED.

## 2015-07-22 NOTE — Telephone Encounter (Signed)
Pt came by today because he is having rectal bleeding. He is having abd pain. He pain level is about a 6-7. He notice small spotting yesterday but a lot more today. This all started around 6 am. Rectal pain when he has to have a BM.

## 2015-07-22 NOTE — Telephone Encounter (Signed)
Has had banding X 3. May have anal fissure. Seems to be classic symptoms with the knife-like pain and bleeding.   I have sent in nitroglycerin ointment to use twice a day per rectum for 2 weeks. Make sure he wears gloves, washes his hands after. Monitor for headache.   I would also like him to use the anusol cream BID, alternating with when he uses the nitro. Use this for only 1 week.

## 2015-07-22 NOTE — Telephone Encounter (Signed)
[phone]  PLEASE CALL PATIENT   HE STATED HE IS HAVING RECTAL BLEEDING LIKE A PERIOD,  ALSO HAVING STOMACH CRAMPS

## 2015-07-27 ENCOUNTER — Other Ambulatory Visit: Payer: Self-pay | Admitting: Internal Medicine

## 2015-07-27 ENCOUNTER — Other Ambulatory Visit: Payer: Self-pay

## 2015-07-27 DIAGNOSIS — K625 Hemorrhage of anus and rectum: Secondary | ICD-10-CM

## 2015-07-27 NOTE — Telephone Encounter (Addendum)
Pt called this am. He is still having some bleeding with his bm's. He said the blood is in the toilet and when he wipes. He got the 0.4% nitro cream and the anusol and it hasnt worked. He is having some lower abd pain. He is very concerned and wants to know what he should do.  Routing to RMR since AS is out of the office.

## 2015-07-27 NOTE — Telephone Encounter (Signed)
Tried to call pt- he said earlier that it was ok to leave him a message because where he is working he may not get good cell coverage. LM with instructions. Lab order done.   Please schedule ov.

## 2015-07-27 NOTE — Telephone Encounter (Signed)
MADE PATIENT APPOINTMENT AND LEFT MASSAGE ON HIS CELL

## 2015-07-27 NOTE — Telephone Encounter (Signed)
CBC today. Anusol and nitroglycerin is probably the best we can do from a topical therapy standpoint. Abdominal pain sounds new.  Continue topical treatment. Get labs done. Arrange for office visit with an extender later this week.  He did report all of his hemorrhoidal symptoms were improved at the time of his last banding visit.

## 2015-07-28 ENCOUNTER — Ambulatory Visit (INDEPENDENT_AMBULATORY_CARE_PROVIDER_SITE_OTHER): Payer: Federal, State, Local not specified - PPO | Admitting: Nurse Practitioner

## 2015-07-28 ENCOUNTER — Ambulatory Visit: Payer: Federal, State, Local not specified - PPO

## 2015-07-28 ENCOUNTER — Encounter: Payer: Self-pay | Admitting: Nurse Practitioner

## 2015-07-28 VITALS — BP 121/84 | HR 71 | Temp 97.6°F | Ht 68.0 in | Wt 177.4 lb

## 2015-07-28 DIAGNOSIS — K2270 Barrett's esophagus without dysplasia: Secondary | ICD-10-CM | POA: Diagnosis not present

## 2015-07-28 DIAGNOSIS — K625 Hemorrhage of anus and rectum: Secondary | ICD-10-CM

## 2015-07-28 DIAGNOSIS — R1030 Lower abdominal pain, unspecified: Secondary | ICD-10-CM

## 2015-07-28 DIAGNOSIS — R195 Other fecal abnormalities: Secondary | ICD-10-CM | POA: Insufficient documentation

## 2015-07-28 DIAGNOSIS — R109 Unspecified abdominal pain: Secondary | ICD-10-CM | POA: Insufficient documentation

## 2015-07-28 LAB — IFOBT (OCCULT BLOOD): IMMUNOLOGICAL FECAL OCCULT BLOOD TEST: NEGATIVE

## 2015-07-28 NOTE — Progress Notes (Signed)
Referring Provider: Susy Frizzle, MD Primary Care Physician:  Odette Fraction, MD Primary GI:  Dr. Gala Romney  Chief Complaint  Patient presents with  . Rectal Bleeding    HPI:   39-year-old male presents for follow-up on abdominal pain, rectal pain, rectal bleeding. He is status post hemorrhoid banding in office 3. Per Dr. Gala Romney telephone note patient states hemorrhoid symptoms were improved after his last banding. However other telephone notes indicate he is still having sharp rectal pain and bleeding. Nitroglycerin ointment and Anusol cream recall then but the patient called a week later saying he was not helping. CBC ordered 07/27/2015 does not appear to been done yet. EGD with Barrett's, recommend repeat in 3 years (2019). Last colonoscopy 2014.  Today he states he initiallydid well after banding x 3. About 5 days ago he began having more rectal bleeding, bright red "like I was on my period." Denies melena. Lower abdominal pain, crampy and sometimes sharp. Has a bowel movement 4-6 times a day and very loose occasionally watery which also started a week ago. No straining. No improvement in pain after a bowel movement. Denies fever. Did have a sudden sweat yesterday at work associated with vomiting. Also with nausea over the past week. Not taking PPI, is taking Zantac occasionally, GERD symptoms well-controlled with diet changes. Admits occasional episode of dizziness over the past week. Denies chest pain, dyspnea, syncope, near syncope. Denies any other upper or lower GI symptoms.  Past Medical History  Diagnosis Date  . Barrett's esophagus   . GERD (gastroesophageal reflux disease)   . Bursitis of elbow     Right  . Bone spur   . Hemorrhoids   . Hiatal hernia     Past Surgical History  Procedure Laterality Date  . Esophagogastroduodenoscopy  12/23/2010    RMR: short-segment Barrett's  . Colonoscopy    07/09/2007    Friable anal canal, occult fissure not excluded   Otherwise, normal rectum, colon, and terminal ileum  . Hand surgery    . Hemorrhoid surgery    . Thumb surgery    . Shoulder surgery    . Colonoscopy with esophagogastroduodenoscopy (egd) N/A 04/26/2013    Dr. Gala Romney: colonoscopy with internal hemorrhoids otherwise normal. EGD with erosive reflux esophagitis, +Barrett's. Due for Barrett' surveillance 2015.   Marland Kitchen Esophagogastroduodenoscopy N/A 11/28/2014    Dr.Rourk- Erosive reflux esophagitis. Abnormal distal esophagus consistent with short segment Barrett's esophagus-status post biopsy, hiatal hernia.bx= barretts esophagus.   . Hemorrhoid banding  2016    Dr.Rourk    Current Outpatient Prescriptions  Medication Sig Dispense Refill  . hydrocortisone (ANUSOL-HC) 2.5 % rectal cream Place 1 application rectally 2 (two) times daily. 30 g 1  . Nitroglycerin 0.4 % OINT Place 1 inch rectally 2 (two) times daily. 1 Tube 0  . pantoprazole (PROTONIX) 40 MG tablet Take 1 tablet (40 mg total) by mouth daily. Take 30 minutes before breakfast 90 tablet 3  . polyethylene glycol powder (GLYCOLAX/MIRALAX) powder Take 17 g by mouth 2 (two) times daily. Until daily soft stools  OTC 17 g 0  . cephALEXin (KEFLEX) 500 MG capsule Take 1 capsule (500 mg total) by mouth 4 (four) times daily. (Patient not taking: Reported on 05/01/2015) 20 capsule 0  . HYDROcodone-acetaminophen (NORCO/VICODIN) 5-325 MG per tablet Take 1 tablet by mouth every 4 (four) hours as needed. (Patient not taking: Reported on 05/01/2015) 15 tablet 0  . ondansetron (ZOFRAN) 4 MG tablet Take 1 tablet (4 mg total)  by mouth every 8 (eight) hours as needed for nausea or vomiting. (Patient not taking: Reported on 07/28/2015) 10 tablet 0  . ranitidine (ZANTAC) 150 MG tablet Take 1 tablet (150 mg total) by mouth 2 (two) times daily. (Patient not taking: Reported on 07/28/2015) 60 tablet 0   No current facility-administered medications for this visit.    Allergies as of 07/28/2015  . (No Known Allergies)     Family History  Problem Relation Age of Onset  . Colon cancer Father     Social History   Social History  . Marital Status: Married    Spouse Name: N/A  . Number of Children: N/A  . Years of Education: N/A   Social History Main Topics  . Smoking status: Current Every Day Smoker -- 1.00 packs/day for 30 years    Types: Cigarettes  . Smokeless tobacco: Former Systems developer  . Alcohol Use: No  . Drug Use: Yes    Special: Marijuana     Comment: occasional use  . Sexual Activity: No   Other Topics Concern  . None   Social History Narrative   ** Merged History Encounter **        Review of Systems: General: Negative for anorexia, weight loss, fever, chills, fatigue, weakness. Eyes: Negative for vision changes.  CV: Negative for chest pain, angina, palpitations, dyspnea on exertion, peripheral edema.  Respiratory: Negative for dyspnea at rest, dyspnea on exertion, cough, sputum, wheezing.  GI: See history of present illness. Endo: Negative for unusual weight change.    Physical Exam: BP 121/84 mmHg  Pulse 71  Temp(Src) 97.6 F (36.4 C) (Oral)  Ht 5\' 8"  (1.727 m)  Wt 177 lb 6.4 oz (80.468 kg)  BMI 26.98 kg/m2 General:   Alert and oriented. Pleasant and cooperative. Well-nourished and well-developed.  Head:  Normocephalic and atraumatic. Cardiovascular:  S1, S2 present without murmurs appreciated.Extremities without clubbing or edema. Respiratory:  Clear to auscultation bilaterally. No wheezes, rales, or rhonchi. No distress.  Gastrointestinal:  +BS, soft, and non-distended. Mild lower abdominal TTP. No HSM noted. No guarding or rebound. No masses appreciated.  Rectal:  Deferred  Neurologic:  Alert and oriented x4;  grossly normal neurologically. Psych:  Alert and cooperative. Normal mood and affect. Heme/Lymph/Immune: No excessive bruising noted.    07/28/2015 10:17 AM

## 2015-07-28 NOTE — Assessment & Plan Note (Signed)
Loose stools with other associated symptoms as noted above. Stool studies and iFob for blood presents as noted above. Follow-up in 3-4 weeks is already scheduled.

## 2015-07-28 NOTE — Assessment & Plan Note (Signed)
History of Barrett's esophagus with recommendations after last endoscopy to remain on PPI. Today patient states he takes Zantac as needed and is not currently taking Protonix or any other PPI. Again advised him strongly to take his PPI as ordered and educated as to why. States he will do so. Endoscopy up-to-date and is due again in 2019. Follow-up is aren't scheduled.

## 2015-07-28 NOTE — Progress Notes (Signed)
cc'ed to pcp °

## 2015-07-28 NOTE — Patient Instructions (Addendum)
1. Have your stool tests done at home and bring them to the lab. When you bring those to the lab, have your blood work drawn. 2. Keep your appointment for follow-up in 1 month.

## 2015-07-28 NOTE — Assessment & Plan Note (Signed)
Lower abdominal crampy pain with occasional sharp pain as well as nausea, and isolated incident of vomiting all within the past week. Given his presentation as well as loose stools we will order stool studies to check for gastrointestinal infection. Also a possible viral gastroenteritis. We'll also provide iFob as noted above. Return for follow-up at our to schedule an appointment in 3-4 weeks.

## 2015-07-28 NOTE — Assessment & Plan Note (Signed)
Patient with a history of rectal bleeding and internal hemorrhoids. Has undergone hemorrhoid banding 3 in the office. Initially he responded quite well to treatment and his hemorrhoid symptoms resolved. He began having loose stools with recurrence of rectal bleeding and sharp rectal pain. He was given nitroglycerin ointment and Anusol cream which is working marginally well only. Likely benign anorectal source given exacerbation of loose stools and frequent bowel movements or possible re-irritation of hemorrhoid sites. At this point we'll check stool studies for gastrointestinal infection as well as iFob to confirm presence of blood. Possible viral gastroenteritis. Heart he has a follow-up scheduled in 3-4 weeks and advised to keep this appointment.

## 2015-07-28 NOTE — Progress Notes (Signed)
Pt returned one iFOBT and it was negative.  

## 2015-07-29 ENCOUNTER — Other Ambulatory Visit: Payer: Self-pay | Admitting: Nurse Practitioner

## 2015-07-29 ENCOUNTER — Telehealth: Payer: Self-pay

## 2015-07-29 DIAGNOSIS — A0472 Enterocolitis due to Clostridium difficile, not specified as recurrent: Secondary | ICD-10-CM

## 2015-07-29 LAB — GIARDIA ANTIGEN: Giardia Screen (EIA): NEGATIVE

## 2015-07-29 LAB — CLOSTRIDIUM DIFFICILE BY PCR: Toxigenic C. Difficile by PCR: DETECTED — CR

## 2015-07-29 MED ORDER — METRONIDAZOLE 500 MG PO TABS: 500.0000 mg | ORAL_TABLET | Freq: Three times a day (TID) | ORAL | Status: DC

## 2015-07-29 NOTE — Telephone Encounter (Signed)
Routing to EG 

## 2015-07-29 NOTE — Telephone Encounter (Signed)
Per tech at [facility]. Pt Cdiff is Positve

## 2015-08-01 LAB — STOOL CULTURE

## 2015-08-14 ENCOUNTER — Ambulatory Visit: Payer: Federal, State, Local not specified - PPO | Admitting: Internal Medicine

## 2015-08-21 ENCOUNTER — Encounter: Payer: Self-pay | Admitting: Internal Medicine

## 2015-08-21 ENCOUNTER — Encounter: Payer: Federal, State, Local not specified - PPO | Admitting: Internal Medicine

## 2015-08-21 ENCOUNTER — Telehealth: Payer: Self-pay | Admitting: Internal Medicine

## 2015-08-21 NOTE — Telephone Encounter (Signed)
PT WAS A NO SHOW AND LETTER SENT  °

## 2015-11-02 ENCOUNTER — Encounter: Payer: Self-pay | Admitting: Internal Medicine

## 2015-11-24 ENCOUNTER — Encounter: Payer: Self-pay | Admitting: Gastroenterology

## 2015-11-24 ENCOUNTER — Ambulatory Visit (INDEPENDENT_AMBULATORY_CARE_PROVIDER_SITE_OTHER): Payer: Federal, State, Local not specified - PPO | Admitting: Gastroenterology

## 2015-11-24 VITALS — BP 120/80 | HR 59 | Temp 97.9°F | Ht 68.0 in | Wt 177.6 lb

## 2015-11-24 DIAGNOSIS — R197 Diarrhea, unspecified: Secondary | ICD-10-CM

## 2015-11-24 DIAGNOSIS — K2270 Barrett's esophagus without dysplasia: Secondary | ICD-10-CM

## 2015-11-24 DIAGNOSIS — K625 Hemorrhage of anus and rectum: Secondary | ICD-10-CM

## 2015-11-24 MED ORDER — DEXLANSOPRAZOLE 60 MG PO CPDR: 60.0000 mg | DELAYED_RELEASE_CAPSULE | Freq: Every day | ORAL | Status: DC

## 2015-11-24 NOTE — Progress Notes (Signed)
Primary Care Physician: Odette Fraction, MD  Primary Gastroenterologist:  Garfield Cornea, MD   Chief Complaint  Patient presents with  . Rectal Bleeding  . Abdominal Pain  . Nausea  . Emesis    HPI: [name] [name] is a 40 y.o. male with history of Barrett's esophagus who presents for further evaluation of rectal bleeding, abdominal pain, nausea and vomiting, diarrhea. Last seen in October 2016. Patient is status post hemorrhoid banding 3 in her office per Dr. Gala Romney. Last colonoscopy 2014. EGD with Barrett's, next EGD for 2019. After his last visit he was found to have C. difficile. He was treated with vancomycin.  Patient presents back today stating that he has been having vomiting and diarrhea again. Within 20 minutes of waking up he starts vomiting family stuff. Denies frequent heartburn. States he has heartburn about once per month he does not take any medication for heartburn although he has a history of Barrett's and has been counseled on the need of taking chronic PPI. States he watches his diet closely. States he stopped all his medication causing thought that was causing him to have vomiting. Vomiting did not improve however. He says he cannot read the last time he had a solid bowel movement. He has multiple loose stools daily. A couple of days ago he believes he had a GI bug on top of his chronic symptoms. He continues to have some intermittent rectal bleeding. Complains of crampy abdominal pain. No dysphagia. He wonders if he should be checked for Crohn's disease.     No current outpatient prescriptions on file.   No current facility-administered medications for this visit.    Allergies as of 11/24/2015  . (No Known Allergies)   Past Medical History  Diagnosis Date  . Barrett's esophagus   . GERD (gastroesophageal reflux disease)   . Bursitis of elbow     Right  . Bone spur   . Hemorrhoids   . Hiatal hernia   . H/O Clostridium difficile infection    Past  Surgical History  Procedure Laterality Date  . Esophagogastroduodenoscopy  12/23/2010    RMR: short-segment Barrett's  . Colonoscopy    07/09/2007    Friable anal canal, occult fissure not excluded  Otherwise, normal rectum, colon, and terminal ileum  . Hand surgery    . Hemorrhoid surgery    . Thumb surgery    . Shoulder surgery    . Colonoscopy with esophagogastroduodenoscopy (egd) N/A 04/26/2013    Dr. Gala Romney: colonoscopy with internal hemorrhoids otherwise normal. EGD with erosive reflux esophagitis, +Barrett's. Due for Barrett' surveillance 2015.   Marland Kitchen Esophagogastroduodenoscopy N/A 11/28/2014    Dr.Rourk- Erosive reflux esophagitis. Abnormal distal esophagus consistent with short segment Barrett's esophagus-status post biopsy, hiatal hernia.bx= barretts esophagus.   . Hemorrhoid banding  2016    Dr.Rourk   Family History  Problem Relation Age of Onset  . Colon cancer Father 50  . Colon polyps Other     sister, paternal grandfather   Social History   Social History  . Marital Status: Married    Spouse Name: N/A  . Number of Children: N/A  . Years of Education: N/A   Social History Main Topics  . Smoking status: Current Every Day Smoker -- 1.00 packs/day for 30 years    Types: Cigarettes  . Smokeless tobacco: Former Systems developer  . Alcohol Use: No  . Drug Use: Yes    Special: Marijuana     Comment: occasional use  .  Sexual Activity: No   Other Topics Concern  . None   Social History Narrative   ** Merged History Encounter **        ROS:  General: Negative for anorexia, weight loss, fever, chills, fatigue, weakness. ENT: Negative for hoarseness, difficulty swallowing , nasal congestion. CV: Negative for chest pain, angina, palpitations, dyspnea on exertion, peripheral edema.  Respiratory: Negative for dyspnea at rest, dyspnea on exertion, cough, sputum, wheezing.  GI: See history of present illness. GU:  Negative for dysuria, hematuria, urinary incontinence, urinary  frequency, nocturnal urination.  Endo: Negative for unusual weight change.    Physical Examination:   BP 120/80 mmHg  Pulse 59  Temp(Src) 97.9 F (36.6 C)  Ht 5\' 8"  (1.727 m)  Wt 177 lb 9.6 oz (80.559 kg)  BMI 27.01 kg/m2  General: Well-nourished, well-developed in no acute distress.  Eyes: No icterus. Mouth: Oropharyngeal mucosa moist and pink , no lesions erythema or exudate. Lungs: Clear to auscultation bilaterally.  Heart: Regular rate and rhythm, no murmurs rubs or gallops.  Abdomen: Bowel sounds are normal, nontender, nondistended, no hepatosplenomegaly or masses, no abdominal bruits or hernia , no rebound or guarding.   Extremities: No lower extremity edema. No clubbing or deformities. Neuro: Alert and oriented x 4   Skin: Warm and dry, no jaundice.   Psych: Alert and cooperative, normal mood and affect.

## 2015-11-24 NOTE — Patient Instructions (Signed)
1. Start Dexilant once daily before evening meal for vomiting and Barrett's esophagus. Samples provided. RX sent to pharmacy. 2. Please complete labs and stool studies as soon as possible.

## 2015-11-25 ENCOUNTER — Other Ambulatory Visit: Payer: Self-pay | Admitting: Gastroenterology

## 2015-11-25 LAB — CBC WITH DIFFERENTIAL/PLATELET
Basophils Absolute: 0 10*3/uL (ref 0.0–0.1)
Basophils Relative: 0 % (ref 0–1)
Eosinophils Absolute: 0.2 10*3/uL (ref 0.0–0.7)
Eosinophils Relative: 3 % (ref 0–5)
HCT: 47.1 % (ref 39.0–52.0)
Hemoglobin: 16.1 g/dL (ref 13.0–17.0)
LYMPHS ABS: 3.6 10*3/uL (ref 0.7–4.0)
LYMPHS PCT: 51 % — AB (ref 12–46)
MCH: 31.4 pg (ref 26.0–34.0)
MCHC: 34.2 g/dL (ref 30.0–36.0)
MCV: 91.8 fL (ref 78.0–100.0)
MPV: 9.4 fL (ref 8.6–12.4)
Monocytes Absolute: 0.4 10*3/uL (ref 0.1–1.0)
Monocytes Relative: 6 % (ref 3–12)
Neutro Abs: 2.8 10*3/uL (ref 1.7–7.7)
Neutrophils Relative %: 40 % — ABNORMAL LOW (ref 43–77)
PLATELETS: 274 10*3/uL (ref 150–400)
RBC: 5.13 MIL/uL (ref 4.22–5.81)
RDW: 13.5 % (ref 11.5–15.5)
WBC: 7 10*3/uL (ref 4.0–10.5)

## 2015-11-25 LAB — COMPREHENSIVE METABOLIC PANEL
ALK PHOS: 49 U/L (ref 40–115)
ALT: 17 U/L (ref 9–46)
AST: 14 U/L (ref 10–40)
Albumin: 4.1 g/dL (ref 3.6–5.1)
BUN: 16 mg/dL (ref 7–25)
CO2: 24 mmol/L (ref 20–31)
CREATININE: 1.4 mg/dL — AB (ref 0.60–1.35)
Calcium: 9.2 mg/dL (ref 8.6–10.3)
Chloride: 107 mmol/L (ref 98–110)
Glucose, Bld: 91 mg/dL (ref 65–99)
POTASSIUM: 4.5 mmol/L (ref 3.5–5.3)
Sodium: 140 mmol/L (ref 135–146)
TOTAL PROTEIN: 6.4 g/dL (ref 6.1–8.1)
Total Bilirubin: 0.4 mg/dL (ref 0.2–1.2)

## 2015-11-25 LAB — CLOSTRIDIUM DIFFICILE BY PCR: CDIFFPCR: DETECTED — AB

## 2015-11-25 LAB — TSH: TSH: 2.07 mIU/L (ref 0.40–4.50)

## 2015-11-25 LAB — IGA: IgA: 89 mg/dL (ref 68–379)

## 2015-11-25 LAB — LIPASE: Lipase: 56 U/L (ref 7–60)

## 2015-11-25 LAB — TISSUE TRANSGLUTAMINASE, IGA: Tissue Transglutaminase Ab, IgA: 1 U/mL (ref <4)

## 2015-11-25 MED ORDER — METRONIDAZOLE 500 MG PO TABS: 500.0000 mg | ORAL_TABLET | Freq: Three times a day (TID) | ORAL | Status: DC

## 2015-11-25 MED ORDER — SACCHAROMYCES BOULARDII 250 MG PO CAPS: 500.0000 mg | ORAL_CAPSULE | Freq: Two times a day (BID) | ORAL | Status: DC

## 2015-11-25 NOTE — Progress Notes (Signed)
Quick Note:  Start Flagyl 500mg  PO TID for 14 days.  Florastor 500mg  BID for 30 days.  Clorox based cleaners to clean bathroom. Handwash with soap, no hand sanitizers. Get new toothbrush 1/2way through flagyl treatment. ______

## 2015-11-25 NOTE — Assessment & Plan Note (Signed)
Acute on chronic diarrhea. History of C. difficile, need to check for recurrence. Patient complains of ongoing rectal bleeding, status post hemorrhoid banding 3. Family history significant for colon cancer, father at age 41. Last colonoscopy July 2014. If C. difficile is negative, would pursue colonoscopy as next step to also exclude IBD. We will also screen for celiac disease. Further recommendations to follow.

## 2015-11-25 NOTE — Assessment & Plan Note (Signed)
History of Barrett's esophagus without dysplasia. Next EGD planned for 2019. Continue to urge patient to take PPI given his Barrett's. We will restart today, Dexilant 60mg  daily.

## 2015-11-26 ENCOUNTER — Telehealth: Payer: Self-pay

## 2015-11-26 LAB — GASTROINTESTINAL PATHOGEN PANEL PCR
C. DIFFICILE TOX A/B, PCR: POSITIVE — AB
CRYPTOSPORIDIUM, PCR: NEGATIVE
Campylobacter, PCR: NEGATIVE
E COLI (STEC) STX1/STX2, PCR: NEGATIVE
E COLI 0157, PCR: NEGATIVE
E coli (ETEC) LT/ST PCR: NEGATIVE
Giardia lamblia, PCR: NEGATIVE
Norovirus, PCR: NEGATIVE
Rotavirus A, PCR: NEGATIVE
Salmonella, PCR: NEGATIVE
Shigella, PCR: NEGATIVE

## 2015-11-26 MED ORDER — LANSOPRAZOLE 30 MG PO CPDR: 30.0000 mg | DELAYED_RELEASE_CAPSULE | Freq: Every day | ORAL | Status: DC

## 2015-11-26 NOTE — Progress Notes (Signed)
cc'ed to pcp °

## 2015-11-26 NOTE — Telephone Encounter (Signed)
rx for lansoprazole sent.  And yes, hold PPI while on Cdiff treatment.

## 2015-11-26 NOTE — Telephone Encounter (Signed)
Tried to do a PA for dexilant. Received fax from pts insurance, dexilant is not covered at all, even with a PA. They only cover generics. Pt has tried and failed omeprazole, pantoprozole and esomeprazole.   They will cover: rabeprazole, omeprazole/sodium bicarb, lansoprazole  Do you want to change to one of these? Pt is not going to take anything while he is on treatment for cdiff.

## 2015-12-01 ENCOUNTER — Encounter: Payer: Self-pay | Admitting: Internal Medicine

## 2015-12-01 NOTE — Progress Notes (Signed)
APPT MADE AND LETTER SENT  °

## 2015-12-01 NOTE — Progress Notes (Signed)
Quick Note:  Recommend follow OV in six weeks. ______

## 2016-01-12 ENCOUNTER — Ambulatory Visit: Payer: Federal, State, Local not specified - PPO | Admitting: Gastroenterology

## 2016-01-29 ENCOUNTER — Encounter: Payer: Self-pay | Admitting: Gastroenterology

## 2016-01-29 ENCOUNTER — Ambulatory Visit (INDEPENDENT_AMBULATORY_CARE_PROVIDER_SITE_OTHER): Payer: Federal, State, Local not specified - PPO | Admitting: Gastroenterology

## 2016-01-29 VITALS — BP 124/86 | HR 54 | Temp 98.1°F | Ht 69.0 in | Wt 178.6 lb

## 2016-01-29 DIAGNOSIS — K625 Hemorrhage of anus and rectum: Secondary | ICD-10-CM

## 2016-01-29 DIAGNOSIS — K2270 Barrett's esophagus without dysplasia: Secondary | ICD-10-CM | POA: Diagnosis not present

## 2016-01-29 DIAGNOSIS — R197 Diarrhea, unspecified: Secondary | ICD-10-CM

## 2016-01-29 DIAGNOSIS — R1011 Right upper quadrant pain: Secondary | ICD-10-CM | POA: Diagnosis not present

## 2016-01-29 DIAGNOSIS — A09 Infectious gastroenteritis and colitis, unspecified: Secondary | ICD-10-CM | POA: Diagnosis not present

## 2016-01-29 NOTE — Assessment & Plan Note (Signed)
Continued intermittent rectal bleeding. Previously underwent hemorrhoid banding 3. Last colonoscopy 2014. History of recurrent C. difficile, initial episode in October, positive again in February. Increased from baseline diarrhea. Plan on rechecking for C. difficile. If negative, would pursue colonoscopy to rule out IBD. Concerning is repeated C. difficile infection without significant risk factors.

## 2016-01-29 NOTE — Progress Notes (Signed)
cc'ed to pcp °

## 2016-01-29 NOTE — Addendum Note (Signed)
Addended by: Mahala Menghini on: 01/29/2016 09:58 AM   Modules accepted: Orders

## 2016-01-29 NOTE — Progress Notes (Signed)
Faxed orders to the lab.

## 2016-01-29 NOTE — Progress Notes (Signed)
Primary Care Physician: Odette Fraction, MD  Primary Gastroenterologist:  Garfield Cornea, MD   Chief Complaint  Patient presents with  . Follow-up    HPI: [name] [name] is a 40 y.o. male here For follow-up. She was seen back in February with complaints of vomiting, diarrhea, rectal bleeding. Because of his history of C. difficile back in October, we rechecked a stool specimen. It was positive again. He was treated with Flagyl 500 mg 3 times a day for 14 days. PPI therapy was held. Patient states that this seemed to help someone was on medication but his symptoms worsened when he completed therapy. At baseline he tends to have 3-4 loose stools daily especially postprandially. It has been worse more recently. He has several bowel movements before breakfast plus postprandial stools. Denies nocturnal stools. Continues to have small to moderate volume hematochezia. Previously underwent hemorrhoid banding 3. Wednesday developed right upper quadrant pain which last for about a day. Yesterday he had corn dogs for dinner and this morning he vomited. Denies fever or chills. He does not take PPI a regular basis states he doesn't feel like he needs it. He has been urged to take it regularly because of his history of Barrett's multiple times. Wife is present with him today and did not realize that he was not on this medication. 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 He uses the porta potty quite a bit. There is no way to wash his hands with soap and water. He is not using any sort of cleaners. He wonders if he is getting infected by his coworkers.  Back in February labs including CBC cemented lipase were obtained. Celiac screen was negative. TSH normal. See below for details.  Current Outpatient Prescriptions  Medication Sig Dispense Refill  . lansoprazole (PREVACID) 30 MG capsule Take 1 capsule (30 mg total) by mouth daily before breakfast. (Patient not taking: Reported on 01/29/2016) 90 capsule  3   No current facility-administered medications for this visit.    Allergies as of 01/29/2016  . (No Known Allergies)   Past Medical History  Diagnosis Date  . Barrett's esophagus   . GERD (gastroesophageal reflux disease)   . Bursitis of elbow     Right  . Bone spur   . Hemorrhoids   . Hiatal hernia   . H/O Clostridium difficile infection    Past Surgical History  Procedure Laterality Date  . Esophagogastroduodenoscopy  12/23/2010    RMR: short-segment Barrett's  . Colonoscopy    07/09/2007    Friable anal canal, occult fissure not excluded  Otherwise, normal rectum, colon, and terminal ileum  . Hand surgery    . Hemorrhoid surgery    . Thumb surgery    . Shoulder surgery    . Colonoscopy with esophagogastroduodenoscopy (egd) N/A 04/26/2013    Dr. Gala Romney: colonoscopy with internal hemorrhoids otherwise normal. EGD with erosive reflux esophagitis, +Barrett's. Due for Barrett' surveillance 2015.   Marland Kitchen Esophagogastroduodenoscopy N/A 11/28/2014    Dr.Rourk- Erosive reflux esophagitis. Abnormal distal esophagus consistent with short segment Barrett's esophagus-status post biopsy, hiatal hernia.bx= barretts esophagus.   . Hemorrhoid banding  2016    Dr.Rourk   Family History  Problem Relation Age of Onset  . Colon cancer Father 75  . Colon polyps Other     sister, paternal grandfather   Social History   Social History  . Marital Status: Married    Spouse Name: N/A  . Number of Children: N/A  .  Years of Education: N/A   Social History Main Topics  . Smoking status: Current Every Day Smoker -- 1.00 packs/day for 30 years    Types: Cigarettes  . Smokeless tobacco: Former Systems developer     Comment: one pack daily  . Alcohol Use: No  . Drug Use: Yes    Special: Marijuana     Comment: occasional use  . Sexual Activity: No   Other Topics Concern  . None   Social History Narrative   ** Merged History Encounter **        ROS:  General: Negative for anorexia, weight loss,  fever, chills, fatigue, weakness. ENT: Negative for hoarseness, difficulty swallowing , nasal congestion. CV: Negative for chest pain, angina, palpitations, dyspnea on exertion, peripheral edema.  Respiratory: Negative for dyspnea at rest, dyspnea on exertion, cough, sputum, wheezing.  GI: See history of present illness. GU:  Negative for dysuria, hematuria, urinary incontinence, urinary frequency, nocturnal urination.  Endo: Negative for unusual weight change.    Physical Examination:   BP 124/86 mmHg  Pulse 54  Temp(Src) 98.1 F (36.7 C) (Oral)  Ht 5\' 9"  (1.753 m)  Wt 178 lb 9.6 oz (81.012 kg)  BMI 26.36 kg/m2  General: Well-nourished, well-developed in no acute distress.  Eyes: No icterus. Mouth: Oropharyngeal mucosa moist and pink , no lesions erythema or exudate. Lungs: Clear to auscultation bilaterally.  Heart: Regular rate and rhythm, no murmurs rubs or gallops.  Abdomen: Bowel sounds are normal, Mild right upper quadrant tenderness, nondistended, no hepatosplenomegaly or masses, no abdominal bruits or hernia , no rebound or guarding.   Extremities: No lower extremity edema. No clubbing or deformities. Neuro: Alert and oriented x 4   Skin: Warm and dry, no jaundice.   Psych: Alert and cooperative, normal mood and affect.  Labs:  Lab Results  Component Value Date   WBC 7.0 11/24/2015   HGB 16.1 11/24/2015   HCT 47.1 11/24/2015   MCV 91.8 11/24/2015   PLT 274 11/24/2015   Lab Results  Component Value Date   CREATININE 1.40* 11/24/2015   BUN 16 11/24/2015   NA 140 11/24/2015   K 4.5 11/24/2015   CL 107 11/24/2015   CO2 24 11/24/2015   Lab Results  Component Value Date   ALT 17 11/24/2015   AST 14 11/24/2015   ALKPHOS 49 11/24/2015   BILITOT 0.4 11/24/2015   Lab Results  Component Value Date   LIPASE 56 11/24/2015   Lab Results  Component Value Date   TSH 2.07 11/24/2015    Imaging Studies: No results found.

## 2016-01-29 NOTE — Assessment & Plan Note (Signed)
History of Barrett's esophagus without dysplasia. Next EGD planned for 2019. Again encouraged patient to take PPI daily given his Barrett's. Insurance will not pay for Dexilant. Prescription previously sent in for lansoprazole. He will let me know if it is not available at the pharmacy.

## 2016-01-29 NOTE — Patient Instructions (Signed)
1. Please take lansoprazole 30 mg daily for Barrett's esophagus. Prescription sent to your pharmacy previously, if he having trouble getting it filled please let me know. 2. Collect stool specimen for C. difficile. We will call you with results and further recommendations once available. 3. If you have recurrent right upper abdominal pain, please let me know and we will consider abdominal ultrasound to look at her gallbladder.

## 2016-01-29 NOTE — Progress Notes (Signed)
Please add on Cdiff eia toxin screen too.

## 2016-01-29 NOTE — Assessment & Plan Note (Signed)
Recent right upper quadrant pain, resolved. Cannot exclude biliary etiology. Intermittent vomiting with fried foods. Patient's diet is very poor. Try to decrease red bull consumption, decrease fatty greasy foods. If recurrent symptoms, consider gallbladder workup.

## 2016-01-30 LAB — CLOSTRIDIUM DIFFICILE BY PCR: Toxigenic C. Difficile by PCR: NOT DETECTED

## 2016-02-01 NOTE — Progress Notes (Signed)
Quick Note:  Cdiff is negative. Recommend colonoscopy with Dr. Gala Romney, Dx diarrhea, rectal bleeding ______

## 2016-02-02 ENCOUNTER — Other Ambulatory Visit: Payer: Self-pay

## 2016-02-02 DIAGNOSIS — K625 Hemorrhage of anus and rectum: Secondary | ICD-10-CM

## 2016-02-02 DIAGNOSIS — R197 Diarrhea, unspecified: Secondary | ICD-10-CM

## 2016-02-03 NOTE — Progress Notes (Signed)
Quick Note:    Noted    ______

## 2016-02-04 ENCOUNTER — Encounter (HOSPITAL_COMMUNITY)
Admission: RE | Disposition: A | Discharge status: Home / Self Care | Payer: Self-pay | Source: Ambulatory Visit | Attending: Internal Medicine

## 2016-02-04 ENCOUNTER — Encounter (HOSPITAL_COMMUNITY): Payer: Self-pay | Admitting: *Deleted

## 2016-02-04 ENCOUNTER — Ambulatory Visit (HOSPITAL_COMMUNITY)
Admission: RE | Admit: 2016-02-04 | Discharge: 2016-02-04 | Disposition: A | Discharge status: Home / Self Care | Payer: Federal, State, Local not specified - PPO | Source: Ambulatory Visit | Attending: Internal Medicine | Admitting: Internal Medicine

## 2016-02-04 DIAGNOSIS — K529 Noninfective gastroenteritis and colitis, unspecified: Secondary | ICD-10-CM | POA: Insufficient documentation

## 2016-02-04 DIAGNOSIS — D120 Benign neoplasm of cecum: Secondary | ICD-10-CM | POA: Insufficient documentation

## 2016-02-04 DIAGNOSIS — K640 First degree hemorrhoids: Secondary | ICD-10-CM | POA: Insufficient documentation

## 2016-02-04 DIAGNOSIS — K648 Other hemorrhoids: Secondary | ICD-10-CM | POA: Diagnosis not present

## 2016-02-04 DIAGNOSIS — Z8619 Personal history of other infectious and parasitic diseases: Secondary | ICD-10-CM | POA: Diagnosis not present

## 2016-02-04 DIAGNOSIS — K625 Hemorrhage of anus and rectum: Secondary | ICD-10-CM

## 2016-02-04 DIAGNOSIS — Z86010 Personal history of colonic polyps: Secondary | ICD-10-CM | POA: Insufficient documentation

## 2016-02-04 DIAGNOSIS — Z800 Family history of malignant neoplasm of digestive organs: Secondary | ICD-10-CM | POA: Insufficient documentation

## 2016-02-04 DIAGNOSIS — K921 Melena: Secondary | ICD-10-CM | POA: Diagnosis not present

## 2016-02-04 DIAGNOSIS — F17210 Nicotine dependence, cigarettes, uncomplicated: Secondary | ICD-10-CM | POA: Insufficient documentation

## 2016-02-04 DIAGNOSIS — R197 Diarrhea, unspecified: Secondary | ICD-10-CM

## 2016-02-04 HISTORY — PX: COLONOSCOPY: SHX5424

## 2016-02-04 LAB — C DIFFICILE QUICK SCREEN W PCR REFLEX
C DIFFICILE (CDIFF) INTERP: NEGATIVE
C DIFFICILE (CDIFF) TOXIN: NEGATIVE
C DIFFICLE (CDIFF) ANTIGEN: NEGATIVE

## 2016-02-04 SURGERY — COLONOSCOPY
Anesthesia: Moderate Sedation

## 2016-02-04 MED ORDER — SODIUM CHLORIDE 0.9 % IV SOLN
INTRAVENOUS | Status: DC
  Administered 2016-02-04: 13:00:00 via INTRAVENOUS

## 2016-02-04 MED ORDER — MEPERIDINE HCL 100 MG/ML IJ SOLN
INTRAMUSCULAR | Status: DC | PRN
  Administered 2016-02-04: 50 mg via INTRAVENOUS
  Administered 2016-02-04: 25 mg via INTRAVENOUS
  Administered 2016-02-04: 50 mg via INTRAVENOUS

## 2016-02-04 MED ORDER — ONDANSETRON HCL 4 MG/2ML IJ SOLN
INTRAMUSCULAR | Status: DC | PRN
  Administered 2016-02-04: 4 mg via INTRAVENOUS

## 2016-02-04 MED ORDER — STERILE WATER FOR IRRIGATION IR SOLN
Status: DC | PRN
  Administered 2016-02-04: 14:00:00

## 2016-02-04 MED ORDER — MIDAZOLAM HCL 5 MG/5ML IJ SOLN
INTRAMUSCULAR | Status: DC | PRN
  Administered 2016-02-04 (×3): 2 mg via INTRAVENOUS

## 2016-02-04 MED ORDER — MEPERIDINE HCL 100 MG/ML IJ SOLN
INTRAMUSCULAR | Status: DC
Start: 2016-02-04 — End: 2016-02-04
  Filled 2016-02-04: qty 2

## 2016-02-04 MED ORDER — MIDAZOLAM HCL 5 MG/5ML IJ SOLN
INTRAMUSCULAR | Status: DC
Start: 2016-02-04 — End: 2016-02-04
  Filled 2016-02-04: qty 10

## 2016-02-04 MED ORDER — ONDANSETRON HCL 4 MG/2ML IJ SOLN
INTRAMUSCULAR | Status: AC
  Filled 2016-02-04: qty 2

## 2016-02-04 NOTE — Interval H&P Note (Signed)
History and Physical Interval Note:  02/04/2016 1:48 PM  [name] [name]  has presented today for surgery, with the diagnosis of DIARRHEA/RECTAL BLEEDING  The various methods of treatment have been discussed with the patient and family. After consideration of risks, benefits and other options for treatment, the patient has consented to  Procedure(s) with comments: COLONOSCOPY (N/A) - 200 as a surgical intervention .  The patient's history has been reviewed, patient examined, no change in status, stable for surgery.  I have reviewed the patient's chart and labs.  Questions were answered to the patient's satisfaction.     Dula Havlik  Repeat C. difficile negative patient complains persistent watery diarrhea with intermittent rectal bleeding. Diagnostic colonoscopy for plan today The risks, benefits, limitations, alternatives and imponderables have been reviewed with the patient. Questions have been answered. All parties are agreeable.

## 2016-02-04 NOTE — Discharge Instructions (Signed)
Office will call with the report from specimens, further recommendations to follow  Colonoscopy, Care After Refer to this sheet in the next few weeks. These instructions provide you with information on caring for yourself after your procedure. Your health care provider may also give you more specific instructions. Your treatment has been planned according to current medical practices, but problems sometimes occur. Call your health care provider if you have any problems or questions after your procedure. WHAT TO EXPECT AFTER THE PROCEDURE  After your procedure, it is typical to have the following:  A small amount of blood in your stool.  Moderate amounts of gas and mild abdominal cramping or bloating. HOME CARE INSTRUCTIONS  Do not drive, operate machinery, or sign important documents for 24 hours.  You may shower and resume your regular physical activities, but move at a slower pace for the first 24 hours.  Take frequent rest periods for the first 24 hours.  Walk around or put a warm pack on your abdomen to help reduce abdominal cramping and bloating.  Drink enough fluids to keep your urine clear or pale yellow.  You may resume your normal diet as instructed by your health care provider. Avoid heavy or fried foods that are hard to digest.  Avoid drinking alcohol for 24 hours or as instructed by your health care provider.  Only take over-the-counter or prescription medicines as directed by your health care provider.  If a tissue sample (biopsy) was taken during your procedure:  Do not take aspirin or blood thinners for 7 days, or as instructed by your health care provider.  Do not drink alcohol for 7 days, or as instructed by your health care provider.  Eat soft foods for the first 24 hours. SEEK MEDICAL CARE IF: You have persistent spotting of blood in your stool 2-3 days after the procedure. SEEK IMMEDIATE MEDICAL CARE IF:  You have more than a small spotting of blood in your  stool.  You pass large blood clots in your stool.  Your abdomen is swollen (distended).  You have nausea or vomiting.  You have a fever.  You have increasing abdominal pain that is not relieved with medicine.   This information is not intended to replace advice given to you by your health care provider. Make sure you discuss any questions you have with your health care provider.   Document Released: 05/10/2004 Document Revised: 07/17/2013 Document Reviewed: 06/03/2013 Elsevier Interactive Patient Education 2016 [employer] Colon Polyps Polyps are lumps of extra tissue growing inside the body. Polyps can grow in the large intestine (colon). Most colon polyps are noncancerous (benign). However, some colon polyps can become cancerous over time. Polyps that are larger than a pea may be harmful. To be safe, caregivers remove and test all polyps. CAUSES  Polyps form when mutations in the genes cause your cells to grow and divide even though no more tissue is needed. RISK FACTORS There are a number of risk factors that can increase your chances of getting colon polyps. They include:  Being older than 50 years.  Family history of colon polyps or colon cancer.  Long-term colon diseases, such as colitis or Crohn disease.  Being overweight.  Smoking.  Being inactive.  Drinking too much alcohol. SYMPTOMS  Most small polyps do not cause symptoms. If symptoms are present, they may include:  Blood in the stool. The stool may look dark red or black.  Constipation or diarrhea that lasts longer than 1 week. DIAGNOSIS People  often do not know they have polyps until their caregiver finds them during a regular checkup. Your caregiver can use 4 tests to check for polyps:  Digital rectal exam. The caregiver wears gloves and feels inside the rectum. This test would find polyps only in the rectum.  Barium enema. The caregiver puts a liquid called barium into your rectum before taking X-rays  of your colon. Barium makes your colon look white. Polyps are dark, so they are easy to see in the X-ray pictures.  Sigmoidoscopy. A thin, flexible tube (sigmoidoscope) is placed into your rectum. The sigmoidoscope has a light and tiny camera in it. The caregiver uses the sigmoidoscope to look at the last third of your colon.  Colonoscopy. This test is like sigmoidoscopy, but the caregiver looks at the entire colon. This is the most common method for finding and removing polyps. TREATMENT  Any polyps will be removed during a sigmoidoscopy or colonoscopy. The polyps are then tested for cancer. PREVENTION  To help lower your risk of getting more colon polyps:  Eat plenty of fruits and vegetables. Avoid eating fatty foods.  Do not smoke.  Avoid drinking alcohol.  Exercise every day.  Lose weight if recommended by your caregiver.  Eat plenty of calcium and folate. Foods that are rich in calcium include milk, cheese, and broccoli. Foods that are rich in folate include chickpeas, kidney beans, and spinach. HOME CARE INSTRUCTIONS Keep all follow-up appointments as directed by your caregiver. You may need periodic exams to check for polyps. SEEK MEDICAL CARE IF: You notice bleeding during a bowel movement.   This information is not intended to replace advice given to you by your health care provider. Make sure you discuss any questions you have with your health care provider.   Document Released: 06/22/2004 Document Revised: 10/17/2014 Document Reviewed: 12/06/2011 Elsevier Interactive Patient Education [employer].

## 2016-02-04 NOTE — Op Note (Signed)
[facility] Patient Name: [name] [name] Procedure Date: 02/04/2016 1:49 PM MRN: [id number] Date of Birth: 1975/11/19 Attending MD: Norvel Richards , MD CSN: [id number] Age: 40 Admit Type: Outpatient Procedure:                Colonoscopy with snate polypsectomy Indications:              Chronic diarrhea, Hematochezia Providers:                Norvel Richards, MD, Gwenlyn Fudge, RN, Georgeann Oppenheim, Technician Referring MD:              Medicines:                Midazolam 6 mg IV, Meperidine 0000000 mg IV Complications:            No immediate complications. Estimated Blood Loss:     Estimated blood loss was minimal. Procedure:                Pre-Anesthesia Assessment:                           - Prior to the procedure, a History and Physical                            was performed, and patient medications and                            allergies were reviewed. The patient's tolerance of                            previous anesthesia was also reviewed. The risks                            and benefits of the procedure and the sedation                            options and risks were discussed with the patient.                            All questions were answered, and informed consent                            was obtained. Prior Anticoagulants: The patient has                            taken no previous anticoagulant or antiplatelet                            agents. ASA Grade Assessment: II - A patient with                            mild systemic disease. After reviewing the risks  and benefits, the patient was deemed in                            satisfactory condition to undergo the procedure.                           After obtaining informed consent, the colonoscope                            was passed under direct vision. Throughout the                            procedure, the patient's blood pressure, pulse, and                             oxygen saturations were monitored continuously. The                            EC-[id number] scope was introduced through                            the anus and advanced to the 5 cm into the ileum.                            The colonoscopy was performed without difficulty.                            The patient tolerated the procedure well. The                            quality of the bowel preparation was adequate. The                            terminal ileum was photographed. Scope In: 1:59:47 PM Scope Out: 2:13:44 PM Scope Withdrawal Time: 0 hours 11 minutes 17 seconds  Total Procedure Duration: 0 hours 13 minutes 57 seconds  Findings:      The perianal and digital rectal examinations were normal.      A 5 mm polyp was found in the cecum. The polyp was sessile. The polyp       was removed with a cold snare. Resection and retrieval were complete.       Estimated blood loss was minimal.      Non-bleeding internal hemorrhoids were found during retroflexion. Scar       from prior hemorrhoid banding also apparent. The hemorrhoids were mild,       small and Grade I (internal hemorrhoids that do not prolapse). Stool       samples submitted for GI pathogen panel for C. difficile      The exam was otherwise without abnormality on direct and retroflexion       views. It is remotely possible the patient could still have festering       infection. 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 Impression:               - (  2) 5 mm polyp in the cecum, removed with a cold                            snare. Resected and retrieved. Stool sample                            collected. No evidence of inflammatory bowel                            disease seen on today's examination.                           - Non-bleeding internal hemorrhoids.                           - The examination was otherwise normal on direct                             and retroflexion views. Moderate Sedation:      Moderate (conscious) sedation was administered by the endoscopy nurse       and supervised by the endoscopist. The following parameters were       monitored: oxygen saturation, heart rate, blood pressure, respiratory       rate, EKG, adequacy of pulmonary ventilation, and response to care.       Total physician intraservice time was 22 minutes. Recommendation:           - Patient has a contact number available for                            emergencies. The signs and symptoms of potential                            delayed complications were discussed with the                            patient. Return to normal activities tomorrow.                            Written discharge instructions were provided to the                            patient.                           - Advance diet as tolerated.                           - Continue present medications.                           - Await pathology results. Follow-up and pending                            stool studies.                           -  Repeat colonoscopy date to be determined after                            pending pathology results are reviewed for                            surveillance based on pathology results.                           - Return to GI office at appointment to be                            scheduled. Procedure Code(s):        --- Professional ---                           [phone], Colonoscopy, flexible; with removal of                            tumor(s), polyp(s), or other lesion(s) by snare                            technique                           99152, Moderate sedation services provided by the                            same physician or other qualified health care                            professional performing the diagnostic or                            therapeutic service that the sedation supports,                             requiring the presence of an independent trained                            observer to assist in the monitoring of the                            patient's level of consciousness and physiological                            status; initial 15 minutes of intraservice time,                            patient age 56 years or older Diagnosis Code(s):        --- Professional ---                           D12.0, Benign neoplasm of cecum  K64.0, First degree hemorrhoids                           K52.9, Noninfective gastroenteritis and colitis,                            unspecified                           K92.1, Melena (includes Hematochezia) CPT copyright 2016 American Medical Association. All rights reserved. The codes documented in this report are preliminary and upon coder review may  be revised to meet current compliance requirements. Cristopher Estimable. Rourk, MD Norvel Richards, MD 02/04/2016 2:28:50 PM This report has been signed electronically. Number of Addenda: 0

## 2016-02-04 NOTE — H&P (View-Only) (Signed)
Primary Care Physician: Odette Fraction, MD  Primary Gastroenterologist:  Garfield Cornea, MD   Chief Complaint  Patient presents with  . Follow-up    HPI: [name] [name] is a 40 y.o. male here For follow-up. She was seen back in February with complaints of vomiting, diarrhea, rectal bleeding. Because of his history of C. difficile back in October, we rechecked a stool specimen. It was positive again. He was treated with Flagyl 500 mg 3 times a day for 14 days. PPI therapy was held. Patient states that this seemed to help someone was on medication but his symptoms worsened when he completed therapy. At baseline he tends to have 3-4 loose stools daily especially postprandially. It has been worse more recently. He has several bowel movements before breakfast plus postprandial stools. Denies nocturnal stools. Continues to have small to moderate volume hematochezia. Previously underwent hemorrhoid banding 3. Wednesday developed right upper quadrant pain which last for about a day. Yesterday he had corn dogs for dinner and this morning he vomited. Denies fever or chills. He does not take PPI a regular basis states he doesn't feel like he needs it. He has been urged to take it regularly because of his history of Barrett's multiple times. Wife is present with him today and did not realize that he was not on this medication. 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 He uses the porta potty quite a bit. There is no way to wash his hands with soap and water. He is not using any sort of cleaners. He wonders if he is getting infected by his coworkers.  Back in February labs including CBC cemented lipase were obtained. Celiac screen was negative. TSH normal. See below for details.  Current Outpatient Prescriptions  Medication Sig Dispense Refill  . lansoprazole (PREVACID) 30 MG capsule Take 1 capsule (30 mg total) by mouth daily before breakfast. (Patient not taking: Reported on 01/29/2016) 90 capsule  3   No current facility-administered medications for this visit.    Allergies as of 01/29/2016  . (No Known Allergies)   Past Medical History  Diagnosis Date  . Barrett's esophagus   . GERD (gastroesophageal reflux disease)   . Bursitis of elbow     Right  . Bone spur   . Hemorrhoids   . Hiatal hernia   . H/O Clostridium difficile infection    Past Surgical History  Procedure Laterality Date  . Esophagogastroduodenoscopy  12/23/2010    RMR: short-segment Barrett's  . Colonoscopy    07/09/2007    Friable anal canal, occult fissure not excluded  Otherwise, normal rectum, colon, and terminal ileum  . Hand surgery    . Hemorrhoid surgery    . Thumb surgery    . Shoulder surgery    . Colonoscopy with esophagogastroduodenoscopy (egd) N/A 04/26/2013    Dr. Gala Romney: colonoscopy with internal hemorrhoids otherwise normal. EGD with erosive reflux esophagitis, +Barrett's. Due for Barrett' surveillance 2015.   Marland Kitchen Esophagogastroduodenoscopy N/A 11/28/2014    Dr.Rourk- Erosive reflux esophagitis. Abnormal distal esophagus consistent with short segment Barrett's esophagus-status post biopsy, hiatal hernia.bx= barretts esophagus.   . Hemorrhoid banding  2016    Dr.Rourk   Family History  Problem Relation Age of Onset  . Colon cancer Father 70  . Colon polyps Other     sister, paternal grandfather   Social History   Social History  . Marital Status: Married    Spouse Name: N/A  . Number of Children: N/A  .  Years of Education: N/A   Social History Main Topics  . Smoking status: Current Every Day Smoker -- 1.00 packs/day for 30 years    Types: Cigarettes  . Smokeless tobacco: Former Systems developer     Comment: one pack daily  . Alcohol Use: No  . Drug Use: Yes    Special: Marijuana     Comment: occasional use  . Sexual Activity: No   Other Topics Concern  . None   Social History Narrative   ** Merged History Encounter **        ROS:  General: Negative for anorexia, weight loss,  fever, chills, fatigue, weakness. ENT: Negative for hoarseness, difficulty swallowing , nasal congestion. CV: Negative for chest pain, angina, palpitations, dyspnea on exertion, peripheral edema.  Respiratory: Negative for dyspnea at rest, dyspnea on exertion, cough, sputum, wheezing.  GI: See history of present illness. GU:  Negative for dysuria, hematuria, urinary incontinence, urinary frequency, nocturnal urination.  Endo: Negative for unusual weight change.    Physical Examination:   BP 124/86 mmHg  Pulse 54  Temp(Src) 98.1 F (36.7 C) (Oral)  Ht 5\' 9"  (1.753 m)  Wt 178 lb 9.6 oz (81.012 kg)  BMI 26.36 kg/m2  General: Well-nourished, well-developed in no acute distress.  Eyes: No icterus. Mouth: Oropharyngeal mucosa moist and pink , no lesions erythema or exudate. Lungs: Clear to auscultation bilaterally.  Heart: Regular rate and rhythm, no murmurs rubs or gallops.  Abdomen: Bowel sounds are normal, Mild right upper quadrant tenderness, nondistended, no hepatosplenomegaly or masses, no abdominal bruits or hernia , no rebound or guarding.   Extremities: No lower extremity edema. No clubbing or deformities. Neuro: Alert and oriented x 4   Skin: Warm and dry, no jaundice.   Psych: Alert and cooperative, normal mood and affect.  Labs:  Lab Results  Component Value Date   WBC 7.0 11/24/2015   HGB 16.1 11/24/2015   HCT 47.1 11/24/2015   MCV 91.8 11/24/2015   PLT 274 11/24/2015   Lab Results  Component Value Date   CREATININE 1.40* 11/24/2015   BUN 16 11/24/2015   NA 140 11/24/2015   K 4.5 11/24/2015   CL 107 11/24/2015   CO2 24 11/24/2015   Lab Results  Component Value Date   ALT 17 11/24/2015   AST 14 11/24/2015   ALKPHOS 49 11/24/2015   BILITOT 0.4 11/24/2015   Lab Results  Component Value Date   LIPASE 56 11/24/2015   Lab Results  Component Value Date   TSH 2.07 11/24/2015    Imaging Studies: No results found.

## 2016-02-05 DIAGNOSIS — Z86010 Personal history of colonic polyps: Secondary | ICD-10-CM | POA: Insufficient documentation

## 2016-02-05 LAB — GASTROINTESTINAL PANEL BY PCR, STOOL (REPLACES STOOL CULTURE)
ADENOVIRUS F40/41: NOT DETECTED
Astrovirus: NOT DETECTED
CRYPTOSPORIDIUM: NOT DETECTED
CYCLOSPORA CAYETANENSIS: NOT DETECTED
Campylobacter species: NOT DETECTED
E. coli O157: NOT DETECTED
ENTEROAGGREGATIVE E COLI (EAEC): NOT DETECTED
ENTEROPATHOGENIC E COLI (EPEC): NOT DETECTED
Entamoeba histolytica: NOT DETECTED
Enterotoxigenic E coli (ETEC): NOT DETECTED
GIARDIA LAMBLIA: NOT DETECTED
Norovirus GI/GII: NOT DETECTED
Plesimonas shigelloides: NOT DETECTED
ROTAVIRUS A: DETECTED — AB
SALMONELLA SPECIES: NOT DETECTED
SHIGELLA/ENTEROINVASIVE E COLI (EIEC): NOT DETECTED
Sapovirus (I, II, IV, and V): NOT DETECTED
Shiga like toxin producing E coli (STEC): NOT DETECTED
Vibrio cholerae: NOT DETECTED
Vibrio species: NOT DETECTED
YERSINIA ENTEROCOLITICA: NOT DETECTED

## 2016-02-07 ENCOUNTER — Encounter: Payer: Self-pay | Admitting: Internal Medicine

## 2016-02-08 ENCOUNTER — Encounter: Payer: Self-pay | Admitting: Internal Medicine

## 2016-02-08 ENCOUNTER — Other Ambulatory Visit: Payer: Self-pay | Admitting: Internal Medicine

## 2016-02-08 MED ORDER — DICYCLOMINE HCL 10 MG PO CAPS: 10.0000 mg | ORAL_CAPSULE | Freq: Three times a day (TID) | ORAL | Status: DC

## 2016-02-08 NOTE — Progress Notes (Signed)
APPT MADE AND LETTER SENT  °

## 2016-02-09 ENCOUNTER — Encounter: Payer: Self-pay | Admitting: Internal Medicine

## 2016-02-10 ENCOUNTER — Encounter (HOSPITAL_COMMUNITY): Payer: Self-pay | Admitting: Internal Medicine

## 2016-02-23 ENCOUNTER — Emergency Department (HOSPITAL_COMMUNITY): Payer: Federal, State, Local not specified - PPO

## 2016-02-23 ENCOUNTER — Encounter (HOSPITAL_COMMUNITY): Payer: Self-pay | Admitting: Emergency Medicine

## 2016-02-23 ENCOUNTER — Emergency Department (HOSPITAL_COMMUNITY)
Admission: EM | Admit: 2016-02-23 | Discharge: 2016-02-23 | Disposition: A | Discharge status: Home / Self Care | Payer: Federal, State, Local not specified - PPO | Attending: Emergency Medicine | Admitting: Emergency Medicine

## 2016-02-23 DIAGNOSIS — F17210 Nicotine dependence, cigarettes, uncomplicated: Secondary | ICD-10-CM | POA: Insufficient documentation

## 2016-02-23 DIAGNOSIS — J069 Acute upper respiratory infection, unspecified: Secondary | ICD-10-CM | POA: Diagnosis not present

## 2016-02-23 DIAGNOSIS — J40 Bronchitis, not specified as acute or chronic: Secondary | ICD-10-CM | POA: Diagnosis not present

## 2016-02-23 DIAGNOSIS — R05 Cough: Secondary | ICD-10-CM | POA: Diagnosis present

## 2016-02-23 MED ORDER — CEPHALEXIN 500 MG PO CAPS: 500.0000 mg | ORAL_CAPSULE | Freq: Four times a day (QID) | ORAL | Status: DC

## 2016-02-23 MED ORDER — DEXAMETHASONE 4 MG PO TABS: 4.0000 mg | ORAL_TABLET | Freq: Two times a day (BID) | ORAL | Status: DC

## 2016-02-23 NOTE — Discharge Instructions (Signed)
Your oxygen level is 96% on room air. Your chest xray is negative for pneumonia or emergent changes. Your exam suggest bronchitis and sinusitis and upper respiratory infection. Please increase fluids. Use claritin D, decadron and keflex daily. Upper Respiratory Infection, Adult Most upper respiratory infections (URIs) are caused by a virus. A URI affects the nose, throat, and upper air passages. The most common type of URI is often called "the common cold." HOME CARE   Take medicines only as told by your doctor.  Gargle warm saltwater or take cough drops to comfort your throat as told by your doctor.  Use a warm mist humidifier or inhale steam from a shower to increase air moisture. This may make it easier to breathe.  Drink enough fluid to keep your pee (urine) clear or pale yellow.  Eat soups and other clear broths.  Have a healthy diet.  Rest as needed.  Go back to work when your fever is gone or your doctor says it is okay.  You may need to stay home longer to avoid giving your URI to others.  You can also wear a face mask and wash your hands often to prevent spread of the virus.  Use your inhaler more if you have asthma.  Do not use any tobacco products, including cigarettes, chewing tobacco, or electronic cigarettes. If you need help quitting, ask your doctor. GET HELP IF:  You are getting worse, not better.  Your symptoms are not helped by medicine.  You have chills.  You are getting more short of breath.  You have brown or red mucus.  You have yellow or brown discharge from your nose.  You have pain in your face, especially when you bend forward.  You have a fever.  You have puffy (swollen) neck glands.  You have pain while swallowing.  You have white areas in the back of your throat. GET HELP RIGHT AWAY IF:   You have very bad or constant:  Headache.  Ear pain.  Pain in your forehead, behind your eyes, and over your cheekbones (sinus pain).  Chest  pain.  You have long-lasting (chronic) lung disease and any of the following:  Wheezing.  Long-lasting cough.  Coughing up blood.  A change in your usual mucus.  You have a stiff neck.  You have changes in your:  Vision.  Hearing.  Thinking.  Mood. MAKE SURE YOU:   Understand these instructions.  Will watch your condition.  Will get help right away if you are not doing well or get worse.   This information is not intended to replace advice given to you by your health care provider. Make sure you discuss any questions you have with your health care provider.   Document Released: 03/14/2008 Document Revised: 02/10/2015 Document Reviewed: 01/01/2014 Elsevier Interactive Patient Education [employer].

## 2016-02-23 NOTE — ED Notes (Addendum)
Pt c/o congestion and productive cough with yellow sputum x 3 days. Pt also reports some dizziness/weakness x 3 days.

## 2016-02-23 NOTE — ED Provider Notes (Signed)
CSN: [id number]     Arrival date & time 02/23/16  0806 History   First MD Initiated Contact with Patient 02/23/16 0809     Chief Complaint  Patient presents with  . Cough     (Consider location/radiation/quality/duration/timing/severity/associated sxs/prior Treatment) Patient is a 40 y.o. male presenting with cough. The history is provided by the patient.  Cough Cough characteristics:  Productive Sputum characteristics:  Yellow Severity:  Moderate Onset quality:  Gradual Duration:  3 days Timing:  Intermittent Progression:  Worsening Chronicity:  New Smoker: yes   Context: weather changes   Context: not animal exposure, not exposure to allergens and not smoke exposure   Relieved by:  Nothing Worsened by:  Activity and deep breathing Ineffective treatments:  None tried   Past Medical History  Diagnosis Date  . Barrett's esophagus   . GERD (gastroesophageal reflux disease)   . Bursitis of elbow     Right  . Bone spur   . Hemorrhoids   . Hiatal hernia   . H/O Clostridium difficile infection    Past Surgical History  Procedure Laterality Date  . Esophagogastroduodenoscopy  12/23/2010    RMR: short-segment Barrett's  . Colonoscopy    07/09/2007    Friable anal canal, occult fissure not excluded  Otherwise, normal rectum, colon, and terminal ileum  . Hand surgery    . Hemorrhoid surgery    . Thumb surgery    . Shoulder surgery    . Colonoscopy with esophagogastroduodenoscopy (egd) N/A 04/26/2013    Dr. Gala Romney: colonoscopy with internal hemorrhoids otherwise normal. EGD with erosive reflux esophagitis, +Barrett's. Due for Barrett' surveillance 2015.   Marland Kitchen Esophagogastroduodenoscopy N/A 11/28/2014    Dr.Rourk- Erosive reflux esophagitis. Abnormal distal esophagus consistent with short segment Barrett's esophagus-status post biopsy, hiatal hernia.bx= barretts esophagus.   . Hemorrhoid banding  2016    Dr.Rourk  . Colonoscopy N/A 02/04/2016    Procedure: COLONOSCOPY;  Surgeon:  Daneil Dolin, MD;  Location: [facility];  Service: Endoscopy;  Laterality: N/A;  200   Family History  Problem Relation Age of Onset  . Colon cancer Father 55  . Colon polyps Other     sister, paternal grandfather   Social History  Substance Use Topics  . Smoking status: Current Every Day Smoker -- 1.00 packs/day for 30 years    Types: Cigarettes  . Smokeless tobacco: Former Systems developer     Comment: one pack daily  . Alcohol Use: No    Review of Systems  Respiratory: Positive for cough.   Musculoskeletal: Positive for arthralgias.  All other systems reviewed and are negative.     Allergies  Review of patient's allergies indicates no known allergies.  Home Medications   Prior to Admission medications   Medication Sig Start Date End Date Taking? Authorizing Provider  dicyclomine (BENTYL) 10 MG capsule Take 1 capsule (10 mg total) by mouth 4 (four) times daily -  before meals and at bedtime. Prn cramping and diarrhea. 02/08/16   Daneil Dolin, MD  lansoprazole (PREVACID) 30 MG capsule Take 1 capsule (30 mg total) by mouth daily before breakfast. 11/26/15   Mahala Menghini, PA-C   BP 136/99 mmHg  Pulse 60  Temp(Src) 97.7 F (36.5 C)  Resp 18  Ht 5\' 8"  (1.727 m)  Wt 74 kg  BMI 24.81 kg/m2  SpO2 96% Physical Exam  Constitutional: He is oriented to person, place, and time. He appears well-developed and well-nourished.  Non-toxic appearance.  HENT:  Head:  Normocephalic.  Right Ear: Tympanic membrane and external ear normal.  Left Ear: Tympanic membrane and external ear normal.  Mouth/Throat: Oropharynx is clear and moist.  Nasal congestion present.  Eyes: EOM and lids are normal. Pupils are equal, round, and reactive to light.  Neck: Normal range of motion. Neck supple. Carotid bruit is not present.  Cardiovascular: Normal rate, regular rhythm, normal heart sounds, intact distal pulses and normal pulses.   Pulmonary/Chest: Breath sounds normal. No respiratory distress.   Course breath sounds with few scattered rhonchi present. Symmetrical rise and fall of the chest. The patient speaks in complete sentences without problem.  Abdominal: Soft. Bowel sounds are normal. There is no tenderness. There is no guarding.  Musculoskeletal: Normal range of motion. He exhibits no edema.  Lymphadenopathy:       Head (right side): No submandibular adenopathy present.       Head (left side): No submandibular adenopathy present.    He has no cervical adenopathy.  Neurological: He is alert and oriented to person, place, and time. He has normal strength. No cranial nerve deficit or sensory deficit.  Skin: Skin is warm and dry. No rash noted.  Psychiatric: He has a normal mood and affect. His speech is normal.  Nursing note and vitals reviewed.   ED Course  Procedures (including critical care time) Labs Review Labs Reviewed - No data to display  Imaging Review No results found. I have personally reviewed and evaluated these images and lab results as part of my medical decision-making.   EKG Interpretation None      MDM  Vital signs within normal limits. The pulse oximetry is 96% on room air. The patient speaks in complete sentences without problem. The patient is ambulatory without problem.  The chest x-ray is negative for acute event. I suspect the patient has an upper respiratory infection with mild bronchitis/sinusitis.  The patient will be treated with the decongestant of his choice, short course of steroid medication, and Keflex.    Final diagnoses:  None    **I have reviewed nursing notes, vital signs, and all appropriate lab and imaging results for this patient.Lily Kocher, PA-C 02/23/16 [id number]  Fredia Sorrow, MD 02/23/16 [phone]

## 2016-03-09 ENCOUNTER — Ambulatory Visit (HOSPITAL_COMMUNITY)
Admission: RE | Admit: 2016-03-09 | Discharge: 2016-03-09 | Disposition: A | Discharge status: Home / Self Care | Payer: Federal, State, Local not specified - PPO | Source: Ambulatory Visit | Attending: Gastroenterology | Admitting: Gastroenterology

## 2016-03-09 ENCOUNTER — Encounter: Payer: Self-pay | Admitting: Gastroenterology

## 2016-03-09 ENCOUNTER — Ambulatory Visit (INDEPENDENT_AMBULATORY_CARE_PROVIDER_SITE_OTHER): Payer: Federal, State, Local not specified - PPO | Admitting: Gastroenterology

## 2016-03-09 ENCOUNTER — Other Ambulatory Visit (HOSPITAL_COMMUNITY)
Admission: RE | Admit: 2016-03-09 | Discharge: 2016-03-09 | Disposition: A | Discharge status: Home / Self Care | Payer: Federal, State, Local not specified - PPO | Source: Ambulatory Visit | Attending: Gastroenterology | Admitting: Gastroenterology

## 2016-03-09 VITALS — BP 122/79 | HR 55 | Temp 97.0°F | Ht 69.0 in | Wt 179.8 lb

## 2016-03-09 DIAGNOSIS — R1010 Upper abdominal pain, unspecified: Secondary | ICD-10-CM | POA: Insufficient documentation

## 2016-03-09 LAB — COMPREHENSIVE METABOLIC PANEL
ALT: 29 U/L (ref 17–63)
AST: 26 U/L (ref 15–41)
Albumin: 4.5 g/dL (ref 3.5–5.0)
Alkaline Phosphatase: 54 U/L (ref 38–126)
Anion gap: 7 (ref 5–15)
BUN: 12 mg/dL (ref 6–20)
CHLORIDE: 106 mmol/L (ref 101–111)
CO2: 25 mmol/L (ref 22–32)
CREATININE: 1.06 mg/dL (ref 0.61–1.24)
Calcium: 9.3 mg/dL (ref 8.9–10.3)
GFR calc Af Amer: >60 mL/min (ref >60)
GLUCOSE: 92 mg/dL (ref 65–99)
Potassium: 4.3 mmol/L (ref 3.5–5.1)
Sodium: 138 mmol/L (ref 135–145)
Total Bilirubin: 0.5 mg/dL (ref 0.3–1.2)
Total Protein: 7.3 g/dL (ref 6.5–8.1)

## 2016-03-09 LAB — LIPASE, BLOOD: LIPASE: 28 U/L (ref 11–51)

## 2016-03-09 LAB — CBC
HEMATOCRIT: 47.8 % (ref 39.0–52.0)
Hemoglobin: 16.6 g/dL (ref 13.0–17.0)
MCH: 31.3 pg (ref 26.0–34.0)
MCHC: 34.7 g/dL (ref 30.0–36.0)
MCV: 90.2 fL (ref 78.0–100.0)
Platelets: 264 10*3/uL (ref 150–400)
RBC: 5.3 MIL/uL (ref 4.22–5.81)
RDW: 12.9 % (ref 11.5–15.5)
WBC: 8.7 10*3/uL (ref 4.0–10.5)

## 2016-03-09 MED ORDER — DICYCLOMINE HCL 10 MG PO CAPS: 10.0000 mg | ORAL_CAPSULE | Freq: Three times a day (TID) | ORAL | Status: DC

## 2016-03-09 MED ORDER — ONDANSETRON 4 MG PO TBDP: 4.0000 mg | ORAL_TABLET | Freq: Three times a day (TID) | ORAL | Status: DC | PRN

## 2016-03-09 MED ORDER — LANSOPRAZOLE 30 MG PO CPDR: 30.0000 mg | DELAYED_RELEASE_CAPSULE | ORAL | Status: DC

## 2016-03-09 NOTE — Patient Instructions (Signed)
I sent in Zofran for nausea to your pharmacy and Prevacid for reflux to take every day.   I also sent in Bentyl for abdominal cramping and loose stool, taking before meals and at bedtime.  For this acute episode: go get labs done right away. You will also have an ultrasound done. We will see if your gallbladder or anything else is causing an issue.

## 2016-03-09 NOTE — Assessment & Plan Note (Signed)
Acute onset of abdominal pain with associated N/V. Lower GI issues appear to be at baseline. With acute onset and gallbladder remaining in situ, proceed with stat CBC, CMP, lipase, and US abdomen. Zofran for supportive measures provided. Restart Prevacid, with prescription sent to pharmacy. 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 Trial of Bentyl. Lower GI symptoms at baseline currently.

## 2016-03-09 NOTE — Progress Notes (Signed)
cc'ed to pcp °

## 2016-03-09 NOTE — Progress Notes (Signed)
Quick Note:  Labs are all normal. US abdomen without gallstones. Fatty liver. Needs HIDA this week if at all possible. NEEDS TO TAKE PREVACID DAILY. Continue Zofran for supportive measures. ______

## 2016-03-09 NOTE — Progress Notes (Signed)
Referring Provider: Susy Frizzle, MD Primary Care Physician:  Odette Fraction, MD  Primary GI: Dr. Gala Romney   Chief Complaint  Patient presents with  . Nausea  . Abdominal Pain    HPI:   [name] [name] is a 40 y.o. male presenting today with a history of Cdiff  in October and Feb 2017, recent colonoscopy completed due to persistent diarrhea with evidence of rotavirus on GI pathogen panel, tubular adenomas, otherwise normal. 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 Additional history includes RUQ pain, Barrett's esophagus, with next EGD planned in 2019. Gallbladder remains present.   Past few days nausea, vomiting, difficulty keeping things on his stomach. Pain in epigastric area, upper abdomen. Recently came off of antibiotics for bronchitis. Just this morning was able to hold down water. No fever or chills. No sick contacts. Still with diarrhea. Will have about 4 loose stools in the morning. Needs refill on Prevacid.   Past Medical History  Diagnosis Date  . Barrett's esophagus   . GERD (gastroesophageal reflux disease)   . Bursitis of elbow     Right  . Bone spur   . Hemorrhoids   . Hiatal hernia   . H/O Clostridium difficile infection     Past Surgical History  Procedure Laterality Date  . Esophagogastroduodenoscopy  12/23/2010    RMR: short-segment Barrett's  . Colonoscopy    07/09/2007    Friable anal canal, occult fissure not excluded  Otherwise, normal rectum, colon, and terminal ileum  . Hand surgery    . Hemorrhoid surgery    . Thumb surgery    . Shoulder surgery    . Colonoscopy with esophagogastroduodenoscopy (egd) N/A 04/26/2013    Dr. Gala Romney: colonoscopy with internal hemorrhoids otherwise normal. EGD with erosive reflux esophagitis, +Barrett's. Due for Barrett' surveillance 2015.   Marland Kitchen Esophagogastroduodenoscopy N/A 11/28/2014    Dr.Rourk- Erosive reflux esophagitis. Abnormal distal esophagus consistent with short segment Barrett's esophagus-status  post biopsy, hiatal hernia.bx= barretts esophagus.   . Hemorrhoid banding  2016    Dr.Rourk  . Colonoscopy N/A 02/04/2016    Dr. Gala Romney: tubular adenomas X2, internal hemorrhoids. GI pathogen panel negative Cdiff, +rotavirus. Next colonoscopy in 5 years.     Current Outpatient Prescriptions  Medication Sig Dispense Refill  . dicyclomine (BENTYL) 10 MG capsule Take 1 capsule (10 mg total) by mouth 4 (four) times daily -  before meals and at bedtime. 120 capsule 3  . lansoprazole (PREVACID) 30 MG capsule Take 1 capsule (30 mg total) by mouth every morning. 90 capsule 3  . ondansetron (ZOFRAN ODT) 4 MG disintegrating tablet Take 1 tablet (4 mg total) by mouth every 8 (eight) hours as needed for nausea or vomiting. 40 tablet 0   No current facility-administered medications for this visit.    Allergies as of 03/09/2016  . (No Known Allergies)    Family History  Problem Relation Age of Onset  . Colon cancer Father 29  . Colon polyps Other     sister, paternal grandfather    Social History   Social History  . Marital Status: Married    Spouse Name: N/A  . Number of Children: N/A  . Years of Education: N/A   Social History Main Topics  . Smoking status: Current Every Day Smoker -- 1.00 packs/day for 30 years    Types: Cigarettes  . Smokeless tobacco: Former Systems developer     Comment: one pack daily  . Alcohol Use: No  . Drug Use:  Yes    Special: Marijuana     Comment: occasional use  . Sexual Activity: No   Other Topics Concern  . None   Social History Narrative   ** Merged History Encounter **        Review of Systems: As mentioned in HPI.   Physical Exam: BP 122/79 mmHg  Pulse 55  Temp(Src) 97 F (36.1 C) (Oral)  Ht 5\' 9"  (1.753 m)  Wt 179 lb 12.8 oz (81.557 kg)  BMI 26.54 kg/m2 General:   Alert and oriented. Appears uncomfortable.  Head:  Normocephalic and atraumatic. Eyes:  Conjuctiva clear without scleral icterus. Abdomen:  +BS, soft, TTP upper abdomen, +Murphy's  sign, non-distended. No rebound or guarding. No HSM or masses noted. Msk:  Symmetrical without gross deformities. Normal posture. Extremities:  Without edema. Neurologic:  Alert and  oriented x4;  grossly normal neurologically. Psych:  Alert and cooperative. Normal mood and affect.

## 2016-03-10 ENCOUNTER — Other Ambulatory Visit: Payer: Self-pay

## 2016-03-10 ENCOUNTER — Telehealth: Payer: Self-pay | Admitting: Internal Medicine

## 2016-03-10 DIAGNOSIS — R1010 Upper abdominal pain, unspecified: Secondary | ICD-10-CM

## 2016-03-10 NOTE — Telephone Encounter (Signed)
I spoke with the pt yesterday about his results. He needs to be scheduled for a hida. Please schedule.

## 2016-03-10 NOTE — Telephone Encounter (Signed)
Pt is set for HIDA on 06/06 @ 0800am. Called pt and LMOM with Appt.

## 2016-03-10 NOTE — Telephone Encounter (Signed)
Pt seen yesterday. He had U/S done yesterday and wanted to know what he needed to do about his gallbladder. He said his side is still hurting him. I told him once the provider reads the results of his U/S then they would make recommendations and get back with him.

## 2016-03-15 ENCOUNTER — Encounter (HOSPITAL_COMMUNITY): Payer: Self-pay

## 2016-03-15 ENCOUNTER — Encounter (HOSPITAL_COMMUNITY)
Admission: RE | Admit: 2016-03-15 | Discharge: 2016-03-15 | Disposition: A | Discharge status: Home / Self Care | Payer: Federal, State, Local not specified - PPO | Source: Ambulatory Visit | Attending: Gastroenterology | Admitting: Gastroenterology

## 2016-03-15 DIAGNOSIS — R1010 Upper abdominal pain, unspecified: Secondary | ICD-10-CM | POA: Insufficient documentation

## 2016-03-15 MED ORDER — TECHNETIUM TC 99M MEBROFENIN IV KIT
5.0000 | PACK | Freq: Once | INTRAVENOUS | Status: AC | PRN
Start: 2016-03-15 — End: 2016-03-15
  Administered 2016-03-15: 5.04 via INTRAVENOUS

## 2016-03-23 ENCOUNTER — Telehealth: Payer: Self-pay | Admitting: Internal Medicine

## 2016-03-23 ENCOUNTER — Ambulatory Visit: Payer: Federal, State, Local not specified - PPO | Admitting: Gastroenterology

## 2016-03-23 NOTE — Telephone Encounter (Signed)
Pt called at 0800 this morning asking if his results were available and also said that he hasn't had a BM since his procedure. I told him the nurse wasn't in the office yet and I would put him to her VM.

## 2016-03-23 NOTE — Telephone Encounter (Signed)
May take miralax for constipation. 1 capful, repeat every hour as needed up to 3 doses. See result note.

## 2016-03-23 NOTE — Telephone Encounter (Signed)
Routing to AS 

## 2016-03-23 NOTE — Progress Notes (Signed)
Quick Note:  HIDA is normal. Did he have any symptoms with Ensure? How is he doing now? ______

## 2016-03-24 NOTE — Telephone Encounter (Signed)
Tried to call pt- NA- LMOM with recommendations.  

## 2016-06-30 ENCOUNTER — Other Ambulatory Visit: Payer: Self-pay | Admitting: Gastroenterology

## 2016-07-20 ENCOUNTER — Encounter (HOSPITAL_COMMUNITY): Payer: Self-pay

## 2016-07-20 ENCOUNTER — Emergency Department (HOSPITAL_COMMUNITY)
Admission: EM | Admit: 2016-07-20 | Discharge: 2016-07-20 | Disposition: A | Discharge status: Other Acute Inpatient Hospital | Payer: Federal, State, Local not specified - PPO | Attending: Emergency Medicine | Admitting: Emergency Medicine

## 2016-07-20 DIAGNOSIS — R45850 Homicidal ideations: Secondary | ICD-10-CM | POA: Diagnosis present

## 2016-07-20 DIAGNOSIS — F17210 Nicotine dependence, cigarettes, uncomplicated: Secondary | ICD-10-CM | POA: Insufficient documentation

## 2016-07-20 DIAGNOSIS — F332 Major depressive disorder, recurrent severe without psychotic features: Secondary | ICD-10-CM | POA: Insufficient documentation

## 2016-07-20 DIAGNOSIS — Z79899 Other long term (current) drug therapy: Secondary | ICD-10-CM | POA: Insufficient documentation

## 2016-07-20 DIAGNOSIS — F1210 Cannabis abuse, uncomplicated: Secondary | ICD-10-CM | POA: Insufficient documentation

## 2016-07-20 LAB — RAPID URINE DRUG SCREEN, HOSP PERFORMED
AMPHETAMINES: NOT DETECTED
BENZODIAZEPINES: POSITIVE — AB
Barbiturates: NOT DETECTED
COCAINE: NOT DETECTED
OPIATES: NOT DETECTED
TETRAHYDROCANNABINOL: POSITIVE — AB

## 2016-07-20 LAB — COMPREHENSIVE METABOLIC PANEL
ALT: 36 U/L (ref 17–63)
ANION GAP: 6 (ref 5–15)
AST: 22 U/L (ref 15–41)
Albumin: 4.9 g/dL (ref 3.5–5.0)
Alkaline Phosphatase: 48 U/L (ref 38–126)
BUN: 16 mg/dL (ref 6–20)
CHLORIDE: 108 mmol/L (ref 101–111)
CO2: 27 mmol/L (ref 22–32)
CREATININE: 1.08 mg/dL (ref 0.61–1.24)
Calcium: 9.8 mg/dL (ref 8.9–10.3)
GFR calc non Af Amer: >60 mL/min (ref >60)
Glucose, Bld: 100 mg/dL — ABNORMAL HIGH (ref 65–99)
POTASSIUM: 4.7 mmol/L (ref 3.5–5.1)
SODIUM: 141 mmol/L (ref 135–145)
Total Bilirubin: 0.7 mg/dL (ref 0.3–1.2)
Total Protein: 7.9 g/dL (ref 6.5–8.1)

## 2016-07-20 LAB — CBC
HCT: 47.6 % (ref 39.0–52.0)
HEMOGLOBIN: 16.8 g/dL (ref 13.0–17.0)
MCH: 32.2 pg (ref 26.0–34.0)
MCHC: 35.3 g/dL (ref 30.0–36.0)
MCV: 91.2 fL (ref 78.0–100.0)
Platelets: 268 10*3/uL (ref 150–400)
RBC: 5.22 MIL/uL (ref 4.22–5.81)
RDW: 12.6 % (ref 11.5–15.5)
WBC: 7.3 10*3/uL (ref 4.0–10.5)

## 2016-07-20 LAB — ACETAMINOPHEN LEVEL

## 2016-07-20 LAB — ETHANOL: Alcohol, Ethyl (B): <5 mg/dL (ref <5)

## 2016-07-20 LAB — SALICYLATE LEVEL

## 2016-07-20 MED ORDER — CARBAMAZEPINE ER 200 MG PO TB12
200.0000 mg | ORAL_TABLET | Freq: Two times a day (BID) | ORAL | Status: DC
  Administered 2016-07-20: 200 mg via ORAL
  Filled 2016-07-20 (×2): qty 1

## 2016-07-20 MED ORDER — HYDROXYZINE HCL 25 MG PO TABS: 25.0000 mg | ORAL_TABLET | Freq: Four times a day (QID) | ORAL | Status: DC | PRN

## 2016-07-20 MED ORDER — TRAZODONE HCL 100 MG PO TABS: 100.0000 mg | ORAL_TABLET | Freq: Every day | ORAL | Status: DC

## 2016-07-20 MED ORDER — ACETAMINOPHEN 325 MG PO TABS: 650.0000 mg | ORAL_TABLET | ORAL | Status: DC | PRN

## 2016-07-20 MED ORDER — NICOTINE 21 MG/24HR TD PT24
21.0000 mg | MEDICATED_PATCH | Freq: Every day | TRANSDERMAL | Status: DC
  Administered 2016-07-20: 21 mg via TRANSDERMAL
  Filled 2016-07-20: qty 1

## 2016-07-20 NOTE — ED Notes (Signed)
Patient transferred to [facility].  All belongings were given to his wife per his request.  Patient left the unit ambulatory with [facility].  I have made 4 attempts to call report to them and have been unsuccessful thus far.  I will continue to make attempts to contact them.

## 2016-07-20 NOTE — BH Assessment (Signed)
[place] Assessment Progress Note  Per Waylan Boga, DNP, this pt requires psychiatric hospitalization.  At 14:03 Roderic Palau calls from [facility].  Pt has been accepted to their facility by Dr Dareen Piano; he is to go to the [facility] [employer].  I discussed this with the pt, and he does not believe that he needs hospitalization at this time, but it is unclear from his comments whether he presents as a voluntary or involuntary patient.  After checking his chart and calling [facility], it has been ascertained that presents under voluntary status.  I staffed this with EDP Malvin Johns, MD, and she opines that pt meets criteria for IVC, which she has initiated; she also concurs with plan to transfer pt to [facility]  IVC documents have been faxed to [facility], and at 15:13 Lattie Corns confirms receipt.  As of this writing, service of Findings and Custody Order is pending.  Pt's nurse, Gerrit Friends, has been notified, and agrees to call report to [phone].  Pt is to be transported via [facility].  Pt has been informed of these developments.   Jalene Mullet, [place] Triage Specialist [phone]

## 2016-07-20 NOTE — ED Provider Notes (Signed)
PT has been accepted to [facility] by Dr Dareen Piano.  Pt now is not wanting to go, but meets IVC criteria due to HI.  IVC papers filed.   Malvin Johns, MD 07/20/16 1504

## 2016-07-20 NOTE — BH Assessment (Signed)
Assessment Note  [name] [name] is an 40 y.o. male presenting to [facility] voluntarily. Sts that today he was at work and "something just clicked". Patient reports feeling very overwhelmed with with anger, irritability, and aggressive thoughts. He reports that his mind is racing and "I can't control my anxiety". He reports homicidal thoughts "toward anyone". Patent asked if he has a homicidal plan and he responds by laughing "Well if I really had one would I tell you". Patient denies a history of violent or aggressive behaviors. No legal issues reported. Patient asked if he has access to means. He sts, "Everything is a weapon". He denies current suicidal thoughts with hesitation. Patient asked about previous suicidal thoughts/gestures/attempts. He sts, "Of course I think about suicide, everyone does, don't you?". Patient reports a prior suicide attempt at the age of 61. He tried to shoot himself with his fathers gun but his sibling walked in and stopped him. The previous suicide attempt was triggered by, "My alcoholic father and girlfriend dying". Patient admits to feelings of depression for the past several years. He describes his depressive symptoms as fatigue, irritable, isolating, and crying spells. His appetite is fair. He denies significant weight gain and loss. Patient reports average sleep. He sleeps 6 hrs per day. Patient denies AVH's.  He does not appear to be responding to internal stimuli. Patient appears to be main with pressured speech. He is restless. Affect is flat. He is oriented to time, person, place, and situation. Patient has no history of INPT mental treatment. He does not have a current outpatient psychiatrist or therapist. Patient sts, "I had a psychiatrist years ago but I stopped going because I didn't want to take those medications anymore". He reports regular use of THC; last use was yesterday. No alcohol use reported.   Diagnosis: Major Depressive Disorder, Recurrent, Severe, without  psychotic features; Anxiety Disorder, Severe, Cannabis Use Disorder  Past Medical History:  Past Medical History:  Diagnosis Date  . Barrett's esophagus   . Bone spur   . Bursitis of elbow    Right  . GERD (gastroesophageal reflux disease)   . H/O Clostridium difficile infection   . Hemorrhoids   . Hiatal hernia     Past Surgical History:  Procedure Laterality Date  . COLONOSCOPY    07/09/2007   Friable anal canal, occult fissure not excluded  Otherwise, normal rectum, colon, and terminal ileum  . COLONOSCOPY N/A 02/04/2016   Dr. Gala Romney: tubular adenomas X2, internal hemorrhoids. GI pathogen panel negative Cdiff, +rotavirus. Next colonoscopy in 5 years.   . COLONOSCOPY WITH ESOPHAGOGASTRODUODENOSCOPY (EGD) N/A 04/26/2013   Dr. Gala Romney: colonoscopy with internal hemorrhoids otherwise normal. EGD with erosive reflux esophagitis, +Barrett's. Due for Barrett' surveillance 2015.   Marland Kitchen ESOPHAGOGASTRODUODENOSCOPY  12/23/2010   RMR: short-segment Barrett's  . ESOPHAGOGASTRODUODENOSCOPY N/A 11/28/2014   Dr.Rourk- Erosive reflux esophagitis. Abnormal distal esophagus consistent with short segment Barrett's esophagus-status post biopsy, hiatal hernia.bx= barretts esophagus.   Marland Kitchen HAND SURGERY    . HEMORRHOID BANDING  2016   Dr.Rourk  . HEMORRHOID SURGERY    . SHOULDER SURGERY    . thumb surgery      Family History:  Family History  Problem Relation Age of Onset  . Colon cancer Father 42  . Colon polyps Other     sister, paternal grandfather    Social History:  reports that he has been smoking Cigarettes.  He has a 30.00 pack-year smoking history. He has quit using smokeless tobacco. He reports that  he uses drugs, including Marijuana. He reports that he does not drink alcohol.  Additional Social History:  Alcohol / Drug Use Pain Medications: SEE MAR Prescriptions: SEE MAR Over the Counter: SEE MAR History of alcohol / drug use?: Yes Substance #1 Name of Substance 1: THC 1 - Age of First  Use: 54 or 40 yrs old  1 - Amount (size/oz): 1 gram per day  1 - Frequency: daily  1 - Duration: "several yrs" 1 - Last Use / Amount: last night   CIWA: CIWA-Ar BP: (!) 127/101 Pulse Rate: (!) 54 COWS:    Allergies: No Known Allergies  Home Medications:  (Not in a hospital admission)  OB/GYN Status:  No LMP for male patient.  General Assessment Data [facility] of Assessment: [facility] TTS Assessment: In system Is this a Tele or Face-to-Face Assessment?: Face-to-Face Is this an Initial Assessment or a Re-assessment for this encounter?: Initial Assessment Marital status: Married Tab name:  (n/a) Is patient pregnant?: No Pregnancy Status: No Living Arrangements: Other (Comment), Spouse/significant other, Children ("I live with my spouse and my child") Can pt return to current living arrangement?: Yes Admission Status: Voluntary Is patient capable of signing voluntary admission?: Yes Referral Source: Self/Family/Friend Insurance type:  (Engineer, civil (consulting) and [employer])     [place] Living Arrangements: Other (Comment), Spouse/significant other, Children ("I live with my spouse and my child") Legal Guardian: Other: (no legal guadian ) Name of Psychiatrist:  (no psychiatrist; seen doctors in the past) Name of Therapist:  (no therapist )  Education Status Is patient currently in school?: No Current Grade:  (n/a) Highest grade of school patient has completed:  (11th grade) Name of school:  (n/a) Contact person:  (n/a)  Risk to self with the past 6 months Suicidal Ideation: No Has patient been a risk to self within the past 6 months prior to admission? : No Suicidal Intent: No Has patient had any suicidal intent within the past 6 months prior to admission? : No Is patient at risk for suicide?: No Suicidal Plan?: No Has patient had any suicidal plan within the past 6 months prior to admission? : No Access to Means: No What has been your use of drugs/alcohol  within the last 12 months?:  (THC) Previous Attempts/Gestures: Yes (history of putting gun in mouth (40yrs old)) How many times?:  (1x) Other Self Harm Risks:  (no self harm ) Triggers for Past Attempts: Other (Comment) ("girlfriend died and father was an alcoholic") Intentional Self Injurious Behavior: None Family Suicide History: Yes (Uncle-Paranoid Schizophrenia) Recent stressful life event(s): Other (Comment) (stepfather-dx's with cancer; "I'm use to people dying") Persecutory voices/beliefs?: No Depression: Yes Depression Symptoms: Feeling angry/irritable, Fatigue, Isolating, Tearfulness, Insomnia Substance abuse history and/or treatment for substance abuse?: No  Risk to Others within the past 6 months Homicidal Ideation: Yes-Currently Present Does patient have any lifetime risk of violence toward others beyond the six months prior to admission? 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 Pending?: No Does patient  have a court date: No Is patient on probation?: No  Psychosis Hallucinations: None noted Delusions: None noted  Mental Status Report Appearance/Hygiene: In scrubs Eye Contact: Good Motor Activity: Restlessness Speech: Pressured, Rapid Level of Consciousness: Alert Mood: Anxious, Preoccupied, Irritable Affect: Anxious, Irritable Anxiety Level: Panic Attacks Panic attack frequency:  ("I have panic attackts prettey  frequently") Most recent panic attack:  ("frequently") Thought Processes: Coherent, Relevant Judgement: Impaired Orientation: Person, Time, Situation, Place Obsessive Compulsive Thoughts/Behaviors: None  Cognitive Functioning Concentration: Decreased Memory: Remote Intact, Recent Intact IQ: Average Insight: Fair Impulse Control: Poor Appetite: Poor Weight Loss:  ("I haven't loss any weight") Weight Gain:  ("I haven't gained any weight") Sleep: Decreased Total Hours of Sleep:  (6 hrs of sleep per night) Vegetative Symptoms: None  ADLScreening [facility] Assessment Services) Patient's cognitive ability adequate to safely complete daily activities?: Yes Patient able to express need for assistance with ADLs?: Yes Independently performs ADLs?: Yes (appropriate for developmental age)  Prior Inpatient Therapy Prior Inpatient Therapy: No Prior Therapy Dates:  (n/a) Prior Therapy Facilty/Provider(s):  (n/a) Reason for Treatment:  (n/a)  Prior Outpatient Therapy Prior Outpatient Therapy: Yes Prior Therapy Dates:  (distant past ) Prior Therapy Facilty/Provider(s):  ("I saw several psychiatrist in the distant past; name unk") Reason for Treatment:  (medication managment..past xanax, klonopin, etc. ) Does patient have an ACCT team?: No Does patient have Intensive In-House Services?  : No Does patient have [facility] services? : No Does patient have P4CC services?: No  ADL Screening (condition at time of admission) Patient's cognitive ability adequate to safely complete daily activities?: Yes Is the patient deaf or have difficulty hearing?: No Does the patient have difficulty seeing, even when wearing glasses/contacts?: No Does the patient have difficulty concentrating, remembering, or making decisions?: No Patient able to express need for assistance with ADLs?: Yes Does the patient have difficulty dressing or bathing?: Yes Independently performs ADLs?: Yes (appropriate for developmental  age) Does the patient have difficulty walking or climbing stairs?: No Weakness of Legs: None Weakness of Arms/Hands: None  Home Assistive Devices/Equipment Home Assistive Devices/Equipment: None    Abuse/Neglect Assessment (Assessment to be complete while patient is alone) Physical Abuse: Denies Verbal Abuse: Denies Sexual Abuse: Denies Exploitation of patient/patient's resources: Denies Self-Neglect: Denies Possible abuse reported to:: [place] / Beliefs Cultural Requests During Hospitalization: None Spiritual Requests During Hospitalization: None   Advance Directives (For Healthcare) Does patient have an advance directive?: No Would patient like information on creating an advanced directive?: No - patient declined information Nutrition Screen- [facility] Adult/[facility] Patient's home diet: Regular  Additional Information 1:1 In Past 12 Months?: No CIRT Risk: No Elopement Risk: No Does patient have medical clearance?: Yes     Disposition: Per Waylan Boga, DNP, patient meets criteria for INPT treatment.  Disposition Initial Assessment Completed for this Encounter: Yes  On Site Evaluation by:   Reviewed with Physician:    Waldon Merl [facility] 07/20/2016 12:34 PM

## 2016-07-20 NOTE — ED Triage Notes (Signed)
Per pt, brought voluntary.  Pt has hx of aggressive thoughts towards others.  Today, pt states he wants to hurt people.  Has had feeling for several days.  Used to take anxiety meds but has not taken for years.  When questioned regarding suicidal, pt states "optional".

## 2016-07-20 NOTE — ED Notes (Signed)
Bed: [facility] Expected date:  Expected time:  Means of arrival:  Comments: 

## 2016-07-20 NOTE — ED Notes (Signed)
Pt has been wanded by security belongings collected and pt is changed into scrubs

## 2016-07-20 NOTE — ED Notes (Signed)
He refuses b/p and is agitated and argumentative. He does, however give [place] a urine.

## 2016-07-20 NOTE — Progress Notes (Signed)
07/20/16 1401:  Pt was with visitor.  Victorino Sparrow, LRT/CTRS

## 2016-07-20 NOTE — ED Notes (Signed)
Report given to Jefferson Fuel, RN.

## 2016-07-20 NOTE — ED Provider Notes (Signed)
[place] DEPT Provider Note   CSN: [id number] Arrival date & time: 07/20/16  [id number]     History   Chief Complaint Chief Complaint  Patient presents with  . Homicidal  . Suicidal    HPI [name] [name] is a 40 y.o. male.  Patient with a history of anxiety presents with feelings of wanting to hurt people. He states over the last 2 days he's had increasing anxiety. He's having feelings that he's been hurt someone around him. He denies any suicidal ideations. He states he doesn't want to hurt himself, he just wants to hurt everyone else. He denies any recent illnesses or current physical complaints. He was previously on medicine for anxiety but has not been on it for the last several years. He denies any alcohol use. He smokes marijuana but denies any other drug use.      Past Medical History:  Diagnosis Date  . Barrett's esophagus   . Bone spur   . Bursitis of elbow    Right  . GERD (gastroesophageal reflux disease)   . H/O Clostridium difficile infection   . Hemorrhoids   . Hiatal hernia     Patient Active Problem List   Diagnosis Date Noted  . History of colonic polyps   . RUQ pain 01/29/2016  . Diarrhea 11/24/2015  . Abdominal pain 07/28/2015  . Loose stools 07/28/2015  . Reflux esophagitis   . Rectal bleeding 04/25/2013  . Barrett's esophagus 04/25/2013  . HEMATEMESIS 12/21/2010  . EPIGASTRIC PAIN 12/21/2010    Past Surgical History:  Procedure Laterality Date  . COLONOSCOPY    07/09/2007   Friable anal canal, occult fissure not excluded  Otherwise, normal rectum, colon, and terminal ileum  . COLONOSCOPY N/A 02/04/2016   Dr. Gala Romney: tubular adenomas X2, internal hemorrhoids. GI pathogen panel negative Cdiff, +rotavirus. Next colonoscopy in 5 years.   . COLONOSCOPY WITH ESOPHAGOGASTRODUODENOSCOPY (EGD) N/A 04/26/2013   Dr. Gala Romney: colonoscopy with internal hemorrhoids otherwise normal. EGD with erosive reflux esophagitis, +Barrett's. Due for Barrett'  surveillance 2015.   Marland Kitchen ESOPHAGOGASTRODUODENOSCOPY  12/23/2010   RMR: short-segment Barrett's  . ESOPHAGOGASTRODUODENOSCOPY N/A 11/28/2014   Dr.Rourk- Erosive reflux esophagitis. Abnormal distal esophagus consistent with short segment Barrett's esophagus-status post biopsy, hiatal hernia.bx= barretts esophagus.   Marland Kitchen HAND SURGERY    . HEMORRHOID BANDING  2016   Dr.Rourk  . HEMORRHOID SURGERY    . SHOULDER SURGERY    . thumb surgery         Home Medications    Prior to Admission medications   Medication Sig Start Date End Date Taking? Authorizing Provider  lansoprazole (PREVACID) 30 MG capsule Take 1 capsule (30 mg total) by mouth every morning. Patient taking differently: Take 30 mg by mouth daily as needed (heartburn).  03/09/16  Yes Annitta Needs, NP  ondansetron (ZOFRAN-ODT) 4 MG disintegrating tablet DISSOLVE 1 TABLETS BY MOUTH EVERY 8 HOURS AS NEEDED FOR NAUSEA/VOMITING. 06/30/16  Yes Carlis Stable, NP  dicyclomine (BENTYL) 10 MG capsule Take 1 capsule (10 mg total) by mouth 4 (four) times daily -  before meals and at bedtime. Patient not taking: Reported on 07/20/2016 03/09/16   Annitta Needs, NP    Family History Family History  Problem Relation Age of Onset  . Colon cancer Father 31  . Colon polyps Other     sister, paternal grandfather    Social History Social History  Substance Use Topics  . Smoking status: Current Every Day Smoker  Packs/day: 1.00    Years: 30.00    Types: Cigarettes  . Smokeless tobacco: Former Systems developer     Comment: one pack daily  . Alcohol use No     Allergies   Review of patient's allergies indicates no known allergies.   Review of Systems Review of Systems  Constitutional: Negative for chills, diaphoresis, fatigue and fever.  HENT: Negative for congestion, rhinorrhea and sneezing.   Eyes: Negative.   Respiratory: Negative for cough, chest tightness and shortness of breath.   Cardiovascular: Negative for chest pain and leg swelling.    Gastrointestinal: Negative for abdominal pain, blood in stool, diarrhea, nausea and vomiting.  Genitourinary: Negative for difficulty urinating, flank pain, frequency and hematuria.  Musculoskeletal: Positive for back pain (chronic). Negative for arthralgias.  Skin: Negative for rash.  Neurological: Negative for dizziness, speech difficulty, weakness, numbness and headaches.  Psychiatric/Behavioral: Positive for agitation. The patient is nervous/anxious.      Physical Exam Updated Vital Signs BP (!) 127/101 (BP Location: Left Arm)   Pulse (!) 54   Temp 98.5 F (36.9 C) (Oral)   Resp 16   SpO2 99%   Physical Exam  Constitutional: He is oriented to person, place, and time. He appears well-developed and well-nourished.  HENT:  Head: Normocephalic and atraumatic.  Eyes: Pupils are equal, round, and reactive to light.  Neck: Normal range of motion. Neck supple.  Cardiovascular: Normal rate, regular rhythm and normal heart sounds.   Pulmonary/Chest: Effort normal and breath sounds normal. No respiratory distress. He has no wheezes. He has no rales. He exhibits no tenderness.  Abdominal: Soft. Bowel sounds are normal. There is no tenderness. There is no rebound and no guarding.  Musculoskeletal: Normal range of motion. He exhibits no edema.  Lymphadenopathy:    He has no cervical adenopathy.  Neurological: He is alert and oriented to person, place, and time.  Skin: Skin is warm and dry. No rash noted.  Psychiatric: He has a normal mood and affect.     ED Treatments / Results  Labs (all labs ordered are listed, but only abnormal results are displayed) Labs Reviewed  COMPREHENSIVE METABOLIC PANEL - Abnormal; Notable for the following:       Result Value   Glucose, Bld 100 (*)    All other components within normal limits  ACETAMINOPHEN LEVEL - Abnormal; Notable for the following:    Acetaminophen (Tylenol), Serum <10 (*)    All other components within normal limits  ETHANOL   SALICYLATE LEVEL  CBC  RAPID URINE DRUG SCREEN, HOSP PERFORMED    EKG  EKG Interpretation None       Radiology No results found.  Procedures Procedures (including critical care time)  Medications Ordered in ED Medications  nicotine (NICODERM CQ - dosed in mg/24 hours) patch 21 mg (not administered)  acetaminophen (TYLENOL) tablet 650 mg (not administered)     Initial Impression / Assessment and Plan / ED Course  I have reviewed the triage vital signs and the nursing notes.  Pertinent labs & imaging results that were available during my care of the patient were reviewed by me and considered in my medical decision making (see chart for details).  Clinical Course  Value Comment By Time  Acetaminophen (Tylenol), S: (!) <10 (Reviewed) Malvin Johns, MD 10/11 1138    Patient has been medically cleared and is awaiting TTS consult.  Final Clinical Impressions(s) / ED Diagnoses   Final diagnoses:  Homicidal ideations    New Prescriptions  New Prescriptions   No medications on file     Malvin Johns, MD 07/20/16 1140

## 2016-09-15 ENCOUNTER — Encounter: Payer: Self-pay | Admitting: Gastroenterology

## 2016-09-15 ENCOUNTER — Ambulatory Visit (INDEPENDENT_AMBULATORY_CARE_PROVIDER_SITE_OTHER): Payer: Federal, State, Local not specified - PPO | Admitting: Gastroenterology

## 2016-09-15 ENCOUNTER — Telehealth: Payer: Self-pay

## 2016-09-15 VITALS — BP 127/88 | HR 63 | Temp 98.1°F | Ht 68.0 in | Wt 182.4 lb

## 2016-09-15 DIAGNOSIS — K625 Hemorrhage of anus and rectum: Secondary | ICD-10-CM | POA: Diagnosis not present

## 2016-09-15 DIAGNOSIS — R1032 Left lower quadrant pain: Secondary | ICD-10-CM | POA: Diagnosis not present

## 2016-09-15 DIAGNOSIS — K602 Anal fissure, unspecified: Secondary | ICD-10-CM

## 2016-09-15 LAB — CBC WITH DIFFERENTIAL/PLATELET
BASOS PCT: 0 %
Basophils Absolute: 0 cells/uL (ref 0–200)
EOS PCT: 5 %
Eosinophils Absolute: 380 cells/uL (ref 15–500)
HEMATOCRIT: 49.2 % (ref 38.5–50.0)
HEMOGLOBIN: 16.9 g/dL (ref 13.2–17.1)
LYMPHS ABS: 3572 {cells}/uL (ref 850–3900)
Lymphocytes Relative: 47 %
MCH: 31.4 pg (ref 27.0–33.0)
MCHC: 34.3 g/dL (ref 32.0–36.0)
MCV: 91.3 fL (ref 80.0–100.0)
MONO ABS: 532 {cells}/uL (ref 200–950)
MPV: 9.1 fL (ref 7.5–12.5)
Monocytes Relative: 7 %
Neutro Abs: 3116 cells/uL (ref 1500–7800)
Neutrophils Relative %: 41 %
Platelets: 260 10*3/uL (ref 140–400)
RBC: 5.39 MIL/uL (ref 4.20–5.80)
RDW: 13.6 % (ref 11.0–15.0)
WBC: 7.6 10*3/uL (ref 3.8–10.8)

## 2016-09-15 MED ORDER — NITROGLYCERIN 0.4 % RE OINT: 1.0000 [in_us] | TOPICAL_OINTMENT | Freq: Two times a day (BID) | RECTAL | 0 refills | Status: DC

## 2016-09-15 MED ORDER — LIDOCAINE-HYDROCORTISONE ACE 3-0.5 % RE CREA: 1.0000 | TOPICAL_CREAM | Freq: Two times a day (BID) | RECTAL | 0 refills | Status: DC

## 2016-09-15 NOTE — Progress Notes (Signed)
Please let patient know that his Hgb is 16.9 normal and unchanged from multiple checks throughout the year. Continue plan as outlined at time of OV.

## 2016-09-15 NOTE — Assessment & Plan Note (Signed)
40 y/o male with urgent OV today for four day h/o rectal bleeding in setting of rectal pain and LLQ pain. Baseline diarrhea without worsening of symptoms. No recent abx. H/o Cdiff and rotavirus as outlined. Colonoscopy in 01/2016. Current symptoms likely due to anorectal fissure, less likely perianal abscess, thrombosed hemorrhoids, colitis. Treat with rectiv and anamantel initially. Patient advised if symptoms worsen ie abd pain/rectal pain or bleeding worsen, then go to ER. We will check CBC now. I don't suspect significant GI bleed at this point. No blood noted on exam glove today. Patient to call Monday with progress report.

## 2016-09-15 NOTE — Progress Notes (Signed)
Primary Care Physician:  Odette Fraction, MD  Primary Gastroenterologist:  Garfield Cornea, MD   Chief Complaint  Patient presents with  . Rectal Bleeding    started 4 days ago, getting worse, handful of blood when wipes  . Abdominal Pain    LLQ  . rectal pain    HPI:  [name] [name] is a 40 y.o. male here for urgent office visit. Patient has history of CDiff in 07/2015 and 11/2015, Barrett's esophagus. Gallbladder workup several months ago including abdominal ultrasound and HIDA scan were unremarkable.  Patient complains of four day history of passing fresh blood per rectum. started out a spots of blood on tissue. For past few days, he has had at least 3 episodes per day, red blood in the stool/water and "gobs" on the tissue. No blood clots. States he has seen two black stools in the past month. Also complaining of a lot of rectal burning type pain especially after a BM. Denies straining. For past two days he has had some mild LLQ pain. No fever. No vomiting. Heartburn well controlled. No dysuria or hematuriaRectal pain worse with trying to sit.      Current Outpatient Prescriptions  Medication Sig Dispense Refill  . clonazePAM (KLONOPIN) 0.5 MG tablet Take 5 mg by mouth 4 (four) times daily.    . lansoprazole (PREVACID) 30 MG capsule Take 1 capsule (30 mg total) by mouth every morning. (Patient taking differently: Take 30 mg by mouth daily as needed (heartburn). ) 90 capsule 3  . ondansetron (ZOFRAN-ODT) 4 MG disintegrating tablet DISSOLVE 1 TABLETS BY MOUTH EVERY 8 HOURS AS NEEDED FOR NAUSEA/VOMITING. 40 tablet 0   No current facility-administered medications for this visit.     Allergies as of 09/15/2016  . (No Known Allergies)    Past Medical History:  Diagnosis Date  . Barrett's esophagus   . Bone spur   . Bursitis of elbow    Right  . GERD (gastroesophageal reflux disease)   . H/O Clostridium difficile infection   . Hemorrhoids   . Hiatal hernia     Past Surgical  History:  Procedure Laterality Date  . COLONOSCOPY    07/09/2007   Friable anal canal, occult fissure not excluded  Otherwise, normal rectum, colon, and terminal ileum  . COLONOSCOPY N/A 02/04/2016   Dr. Gala Romney: tubular adenomas X2, internal hemorrhoids. GI pathogen panel negative Cdiff, +rotavirus. Next colonoscopy in 5 years.   . COLONOSCOPY WITH ESOPHAGOGASTRODUODENOSCOPY (EGD) N/A 04/26/2013   Dr. Gala Romney: colonoscopy with internal hemorrhoids otherwise normal. EGD with erosive reflux esophagitis, +Barrett's. Due for Barrett' surveillance 2015.   Marland Kitchen ESOPHAGOGASTRODUODENOSCOPY  12/23/2010   RMR: short-segment Barrett's  . ESOPHAGOGASTRODUODENOSCOPY N/A 11/28/2014   Dr.Rourk- Erosive reflux esophagitis. Abnormal distal esophagus consistent with short segment Barrett's esophagus-status post biopsy, hiatal hernia.bx= barretts esophagus.   Marland Kitchen HAND SURGERY    . HEMORRHOID BANDING  2016   Dr.Rourk  . HEMORRHOID SURGERY    . SHOULDER SURGERY    . thumb surgery      Family History  Problem Relation Age of Onset  . Colon cancer Father 13  . Colon polyps Other     sister, paternal grandfather    Social History   Social History  . Marital status: Married    Spouse name: N/A  . Number of children: N/A  . Years of education: N/A   Occupational History  . Not on file.   Social History Main Topics  . Smoking status: Current Every Day  Smoker    Packs/day: 1.00    Years: 30.00    Types: Cigarettes  . Smokeless tobacco: Former Systems developer     Comment: one pack daily  . Alcohol use No  . Drug use:     Types: Marijuana     Comment: occasional use  . Sexual activity: No   Other Topics Concern  . Not on file   Social History Narrative   ** Merged History Encounter **          ROS:  General: Negative for anorexia, weight loss, fever, chills, fatigue, weakness. Eyes: Negative for vision changes.  ENT: Negative for hoarseness, difficulty swallowing , nasal congestion. CV: Negative for  chest pain, angina, palpitations, dyspnea on exertion, peripheral edema.  Respiratory: Negative for dyspnea at rest, dyspnea on exertion, cough, sputum, wheezing.  GI: See history of present illness. GU:  Negative for dysuria, hematuria, urinary incontinence, urinary frequency, nocturnal urination.  MS: Negative for joint pain, low back pain.  Derm: Negative for rash or itching.  Neuro: Negative for weakness, abnormal sensation, seizure, frequent headaches, memory loss, confusion.  Psych: Negative for anxiety, depression, suicidal ideation, hallucinations.  Endo: Negative for unusual weight change.  Heme: Negative for bruising or bleeding. Allergy: Negative for rash or hives.    Physical Examination:  BP 127/88   Pulse 63   Temp 98.1 F (36.7 C) (Oral)   Ht 5\' 8"  (1.727 m)   Wt 182 lb 6.4 oz (82.7 kg)   BMI 27.73 kg/m    General: Well-nourished, well-developed in no acute distress.  Head: Normocephalic, atraumatic.   Eyes: Conjunctiva pink, no icterus. Mouth: Oropharyngeal mucosa moist and pink , no lesions erythema or exudate. Neck: Supple without thyromegaly, masses, or lymphadenopathy.  Lungs: Clear to auscultation bilaterally.  Heart: Regular rate and rhythm, no murmurs rubs or gallops.  Abdomen: Bowel sounds are normal, minimal LLQ tendrness, nondistended, no hepatosplenomegaly or masses, no abdominal bruits or    hernia , no rebound or guarding.   Rectal: performed with chaperone present. No abnormalities noted externally. No evidence of perianal abscess, internal digital rectal exam with significant tenderness noted posteriorly without palpable mass or thrombosed hemorrhoid.  Extremities: No lower extremity edema. No clubbing or deformities.  Neuro: Alert and oriented x 4 , grossly normal neurologically.  Skin: Warm and dry, no rash or jaundice.   Psych: Alert and cooperative, normal mood and affect.    Imaging Studies: No results found.

## 2016-09-15 NOTE — Patient Instructions (Signed)
1. Start Rectiv (nitro) cream anorectally twice daily and alternate with anamantle (has numbing medication) twice daily. Rective needs to be used 3 weeks. 2. Have blood work done today.  3. If your abdominal pain, bleeding, or rectal pain worsens please go to the ER.  4. If your abdominal pain persists or your labs show any signs of infection, you may require CT scan of your belly.

## 2016-09-15 NOTE — Telephone Encounter (Signed)
If he is having black stool with a lot of blood in it he needs to go to the ER for expedited workup.

## 2016-09-15 NOTE — Telephone Encounter (Signed)
Pt called, 4 days ago he started noticing some spotting of blood with BM. Pt said now he is seeing a lot of blood in the stool, in the water and some of his bm's are black. He is not straining, his stool is the consistency of peanut butter. He is also having pain that is getting worse. Pt is asking to been seen. I have given him an appointment for 1:30 today with LSL.

## 2016-09-16 ENCOUNTER — Telehealth: Payer: Self-pay

## 2016-09-16 NOTE — Progress Notes (Signed)
cc'ed to pcp °

## 2016-09-16 NOTE — Telephone Encounter (Signed)
I called Scott the pharmacist with [facility]. Ordered the cream and added lidocaine to compound. Looks like he is getting nitro separately.

## 2016-09-16 NOTE — Telephone Encounter (Signed)
Received a fax from the pharmacy- anamantel is not covered by pts insurance. The cost is $290.00. They are asking for an alternative. Routing to Henrietta since LSL and RMR are out of the office today.

## 2016-09-16 NOTE — Telephone Encounter (Signed)
Pt is aware. He said it was ok to send in [place] cream.

## 2016-09-16 NOTE — Telephone Encounter (Signed)
[facility] could do a hemorrhoid cream that would be about 30 dollars out of pocket I believe. It would have lidocaine in it. Is he willing to do this?

## 2016-09-19 ENCOUNTER — Telehealth: Payer: Self-pay | Admitting: Internal Medicine

## 2016-09-19 ENCOUNTER — Ambulatory Visit: Payer: Federal, State, Local not specified - PPO | Admitting: Gastroenterology

## 2016-09-19 NOTE — Telephone Encounter (Signed)
Pt wants to go ahead and have CT. Magda Paganini, do you want to order the ct?

## 2016-09-19 NOTE — Telephone Encounter (Signed)
PATIENT CALLED STATING THAT HE IS STILL HAVING ABDOMINAL PAIN AND PASSING BLOOD. [phone] PLEASE CALL

## 2016-09-19 NOTE — Telephone Encounter (Signed)
Tried to call pt- NA-LMOM to return call.  

## 2016-09-19 NOTE — Telephone Encounter (Signed)
He is still having left lower abd pain like before and passing blood(bright red) and some cloths. No fever and eating less.

## 2016-09-21 ENCOUNTER — Other Ambulatory Visit: Payer: Self-pay

## 2016-09-21 DIAGNOSIS — R1032 Left lower quadrant pain: Secondary | ICD-10-CM

## 2016-09-21 DIAGNOSIS — K6289 Other specified diseases of anus and rectum: Secondary | ICD-10-CM

## 2016-09-21 DIAGNOSIS — K921 Melena: Secondary | ICD-10-CM

## 2016-09-21 NOTE — Telephone Encounter (Signed)
Please schedule ct.

## 2016-09-21 NOTE — Telephone Encounter (Signed)
CT A/P with contrast for LLQ pain, rectal pain, bloody stools.

## 2016-09-21 NOTE — Telephone Encounter (Signed)
CT abd/pelvis with contrast scheduled for 09/28/16 at 8:00am. NPO 4 hours prior to test. Pt to p/u contrast at [facility] before test. Called and informed pt.

## 2016-09-22 NOTE — Telephone Encounter (Signed)
Called insurance company this morning for PA for CT abd/pelvis with contrast. Called phone number on back of insurance card for [place]. Was advised by [employer] to call phone number on back of card for Customer Service. Called Customer Service number and [employer] [phone]) for a return phone call.

## 2016-09-23 NOTE — Telephone Encounter (Signed)
[employer] (phone # for precertification on back of insurance card). Was advised they were not showing coverage info for pt. Called and informed pt. He states he does have [employer]. Pt said he is going to call insurance company and will call office back.

## 2016-09-26 NOTE — Telephone Encounter (Signed)
Pt had not called office back. Called pt to follow-up on insurance company. Pt said he hasn't heard anything back from them. Pt stated he wants to cancel CT "since they're being __holes". Called Central Scheduling and cancelled CT abd/pelvis per pt request. Forwarding message to LSL to inform.

## 2016-09-27 NOTE — Telephone Encounter (Signed)
noted 

## 2016-09-28 ENCOUNTER — Ambulatory Visit (HOSPITAL_COMMUNITY): Payer: Federal, State, Local not specified - PPO

## 2016-10-01 NOTE — Progress Notes (Signed)
REVIEWED-NO ADDITIONAL RECOMMENDATIONS. 

## 2016-11-23 ENCOUNTER — Emergency Department (HOSPITAL_COMMUNITY)
Admission: EM | Admit: 2016-11-23 | Discharge: 2016-11-23 | Disposition: A | Discharge status: Home / Self Care | Payer: Federal, State, Local not specified - PPO | Attending: Emergency Medicine | Admitting: Emergency Medicine

## 2016-11-23 ENCOUNTER — Telehealth: Payer: Self-pay | Admitting: Internal Medicine

## 2016-11-23 ENCOUNTER — Encounter (HOSPITAL_COMMUNITY): Payer: Self-pay | Admitting: Emergency Medicine

## 2016-11-23 DIAGNOSIS — K625 Hemorrhage of anus and rectum: Secondary | ICD-10-CM | POA: Diagnosis present

## 2016-11-23 DIAGNOSIS — F17210 Nicotine dependence, cigarettes, uncomplicated: Secondary | ICD-10-CM | POA: Insufficient documentation

## 2016-11-23 LAB — CBC
HCT: 46 % (ref 39.0–52.0)
HEMOGLOBIN: 16.2 g/dL (ref 13.0–17.0)
MCH: 32.3 pg (ref 26.0–34.0)
MCHC: 35.2 g/dL (ref 30.0–36.0)
MCV: 91.8 fL (ref 78.0–100.0)
Platelets: 255 10*3/uL (ref 150–400)
RBC: 5.01 MIL/uL (ref 4.22–5.81)
RDW: 12.8 % (ref 11.5–15.5)
WBC: 7.3 10*3/uL (ref 4.0–10.5)

## 2016-11-23 LAB — COMPREHENSIVE METABOLIC PANEL
ALK PHOS: 44 U/L (ref 38–126)
ALT: 36 U/L (ref 17–63)
AST: 24 U/L (ref 15–41)
Albumin: 4.6 g/dL (ref 3.5–5.0)
Anion gap: 6 (ref 5–15)
BILIRUBIN TOTAL: 0.5 mg/dL (ref 0.3–1.2)
BUN: 10 mg/dL (ref 6–20)
CALCIUM: 9.3 mg/dL (ref 8.9–10.3)
CO2: 31 mmol/L (ref 22–32)
CREATININE: 1.12 mg/dL (ref 0.61–1.24)
Chloride: 106 mmol/L (ref 101–111)
Glucose, Bld: 95 mg/dL (ref 65–99)
Potassium: 4.3 mmol/L (ref 3.5–5.1)
Sodium: 143 mmol/L (ref 135–145)
TOTAL PROTEIN: 7.4 g/dL (ref 6.5–8.1)

## 2016-11-23 LAB — TYPE AND SCREEN
ABO/RH(D): A POS
ANTIBODY SCREEN: NEGATIVE

## 2016-11-23 NOTE — ED Triage Notes (Signed)
Pt c/o black stools and bright red stools x 10 years. Pt c/o increase in bleeding and pain x 3 days.

## 2016-11-23 NOTE — Telephone Encounter (Signed)
Pt called this morning wanting to be worked in. I told him there were no openings at the moment. He continues to tell me that the cream he is using isn't helping and he is hurting bad. I told him that RMR nurse wasn't in yet and to leave message on her VM.

## 2016-11-23 NOTE — ED Provider Notes (Signed)
[place] DEPT Provider Note   CSN: [id number] Arrival date & time: 11/23/16  1142     History   Chief Complaint Chief Complaint  Patient presents with  . Rectal Bleeding    HPI [name] [name] is a 41 y.o. male.  Patient reports increased rectal bleeding for the last several days. He has had multiple gastroenterology procedures in the last couple years including 3 separate hemorrhoidectomies via banding and 4 colonoscopies which revealed cancerous polyps. He sees a Database administrator. He has been eating without vomiting. Severity of symptoms mild to moderate.      Past Medical History:  Diagnosis Date  . Barrett's esophagus   . Bone spur   . Bursitis of elbow    Right  . GERD (gastroesophageal reflux disease)   . H/O Clostridium difficile infection   . Hemorrhoids   . Hiatal hernia     Patient Active Problem List   Diagnosis Date Noted  . LLQ pain 09/15/2016  . Anorectal fissure 09/15/2016  . History of colonic polyps   . RUQ pain 01/29/2016  . Diarrhea 11/24/2015  . Abdominal pain 07/28/2015  . Loose stools 07/28/2015  . Reflux esophagitis   . Rectal bleeding 04/25/2013  . Barrett's esophagus 04/25/2013  . HEMATEMESIS 12/21/2010  . EPIGASTRIC PAIN 12/21/2010    Past Surgical History:  Procedure Laterality Date  . COLONOSCOPY    07/09/2007   Friable anal canal, occult fissure not excluded  Otherwise, normal rectum, colon, and terminal ileum  . COLONOSCOPY N/A 02/04/2016   Dr. Gala Romney: tubular adenomas X2, internal hemorrhoids. GI pathogen panel negative Cdiff, +rotavirus. Next colonoscopy in 5 years.   . COLONOSCOPY WITH ESOPHAGOGASTRODUODENOSCOPY (EGD) N/A 04/26/2013   Dr. Gala Romney: colonoscopy with internal hemorrhoids otherwise normal. EGD with erosive reflux esophagitis, +Barrett's. Due for Barrett' surveillance 2015.   Marland Kitchen ESOPHAGOGASTRODUODENOSCOPY  12/23/2010   RMR: short-segment Barrett's  . ESOPHAGOGASTRODUODENOSCOPY N/A 11/28/2014   Dr.Rourk-  Erosive reflux esophagitis. Abnormal distal esophagus consistent with short segment Barrett's esophagus-status post biopsy, hiatal hernia.bx= barretts esophagus.   Marland Kitchen HAND SURGERY    . HEMORRHOID BANDING  2016   Dr.Rourk  . HEMORRHOID SURGERY    . SHOULDER SURGERY    . thumb surgery         Home Medications    Prior to Admission medications   Medication Sig Start Date End Date Taking? Authorizing Provider  clonazePAM (KLONOPIN) 0.5 MG tablet Take 5 mg by mouth 4 (four) times daily. 08/31/16   Historical Provider, MD  lansoprazole (PREVACID) 30 MG capsule Take 1 capsule (30 mg total) by mouth every morning. Patient taking differently: Take 30 mg by mouth daily as needed (heartburn).  03/09/16   Annitta Needs, NP  lidocaine-hydrocortisone [facility]) 3-0.5 % CREA Place 1 Applicatorful rectally 2 (two) times daily. 09/15/16   Mahala Menghini, PA-C  Nitroglycerin 0.4 % OINT Place 1 inch rectally every 12 (twelve) hours. For 3 weeks 09/15/16   Mahala Menghini, PA-C  ondansetron (ZOFRAN-ODT) 4 MG disintegrating tablet DISSOLVE 1 TABLETS BY MOUTH EVERY 8 HOURS AS NEEDED FOR NAUSEA/VOMITING. 06/30/16   Carlis Stable, NP    Family History Family History  Problem Relation Age of Onset  . Colon cancer Father 48  . Colon polyps Other     sister, paternal grandfather    Social History Social History  Substance Use Topics  . Smoking status: Current Every Day Smoker    Packs/day: 1.00    Years: 30.00  Types: Cigarettes  . Smokeless tobacco: Former Systems developer     Comment: one pack daily  . Alcohol use No     Allergies   Patient has no known allergies.   Review of Systems Review of Systems  All other systems reviewed and are negative.    Physical Exam Updated Vital Signs BP 127/78   Pulse (!) 51   Temp 98.1 F (36.7 C)   Resp 18   Ht 5\' 8"  (1.727 m)   Wt 184 lb (83.5 kg)   SpO2 100%   BMI 27.98 kg/m   Physical Exam  Constitutional: He is oriented to person, place, and time.  He appears well-developed and well-nourished.  HENT:  Head: Normocephalic and atraumatic.  Eyes: Conjunctivae are normal.  Neck: Neck supple.  Cardiovascular: Normal rate and regular rhythm.   Pulmonary/Chest: Effort normal and breath sounds normal.  Abdominal: Soft. Bowel sounds are normal.  Nontender.  Musculoskeletal: Normal range of motion.  Neurological: He is alert and oriented to person, place, and time.  Skin: Skin is warm and dry.  Psychiatric: He has a normal mood and affect. His behavior is normal.  Nursing note and vitals reviewed.    ED Treatments / Results  Labs (all labs ordered are listed, but only abnormal results are displayed) Labs Reviewed  COMPREHENSIVE METABOLIC PANEL  CBC  TYPE AND SCREEN    EKG  EKG Interpretation None       Radiology No results found.  Procedures Procedures (including critical care time)  Medications Ordered in ED Medications - No data to display   Initial Impression / Assessment and Plan / ED Course  I have reviewed the triage vital signs and the nursing notes.  Pertinent labs & imaging results that were available during my care of the patient were reviewed by me and considered in my medical decision making (see chart for details).     Rectal exam offered, but deferred by the patient. His hemoglobin is stable. He does not appear toxic or in acute distress. He has local gastroenterology follow-up.  Final Clinical Impressions(s) / ED Diagnoses   Final diagnoses:  Rectal bleeding    New Prescriptions New Prescriptions   No medications on file     Nat Christen, MD 11/23/16 [phone]

## 2016-11-23 NOTE — Telephone Encounter (Signed)
Not appropriate to schedule a CT when patient has not been seen in 2 months. Recommend OV. ER if severe pain and to expedite work up.   If he is already using lidocaine ointment there are no other alternative "numbing" medications.

## 2016-11-23 NOTE — ED Notes (Signed)
During discharge pt became upset because he was here for four hour and did not get touched by the doctor.  I informed Dr Lacinda Axon.

## 2016-11-23 NOTE — Telephone Encounter (Signed)
I spoke with Sharyn Lull T. With [employer] and she stated the patient does not require a PA for a ct scan as long as it's done as an outpatient  Reference number for the call: [id number]  Magda Paganini, would you like to schedule the ct scan

## 2016-11-23 NOTE — Telephone Encounter (Signed)
Spoke with the pt, he is still having the pain in his rectum that he was having in December, the nitro/lidocaine ointment from [place] doesn't seem to be helping. He is also seeing a lot of blood in his stool. He cant figure out why the insurance wouldn't cover the CT, but said it was fine to schedule it if the insurance will cover it. Rosendo Gros is talking to [employer].  He is been out of work for 2 days and is at work now but is leaving to go home because he cant stand it anymore.   Pt wants to know if we can send in something that will numb it until he can be scheduled for the CT?

## 2016-11-23 NOTE — ED Notes (Signed)
D/c task for occult and saline lock because pt refused per Dr Lacinda Axon.

## 2016-11-23 NOTE — Discharge Instructions (Signed)
Labs were within normal limits. Follow-up your gastroenterologist.

## 2016-11-23 NOTE — Telephone Encounter (Signed)
Patient will proceed to the emergency room at [facility]

## 2016-11-23 NOTE — ED Notes (Signed)
Pt is upset that he did not get pain medication.  Pt says, "I just came here for nothing.  I paid my money and got nothing."

## 2017-02-13 ENCOUNTER — Encounter (HOSPITAL_COMMUNITY): Payer: Self-pay | Admitting: Emergency Medicine

## 2017-02-13 ENCOUNTER — Emergency Department (HOSPITAL_COMMUNITY)
Admission: EM | Admit: 2017-02-13 | Discharge: 2017-02-14 | Disposition: A | Discharge status: Home / Self Care | Payer: Federal, State, Local not specified - PPO | Attending: Emergency Medicine | Admitting: Emergency Medicine

## 2017-02-13 ENCOUNTER — Emergency Department (HOSPITAL_COMMUNITY): Payer: Federal, State, Local not specified - PPO

## 2017-02-13 DIAGNOSIS — R05 Cough: Secondary | ICD-10-CM | POA: Insufficient documentation

## 2017-02-13 DIAGNOSIS — R0789 Other chest pain: Secondary | ICD-10-CM | POA: Insufficient documentation

## 2017-02-13 DIAGNOSIS — R509 Fever, unspecified: Secondary | ICD-10-CM | POA: Insufficient documentation

## 2017-02-13 DIAGNOSIS — F17210 Nicotine dependence, cigarettes, uncomplicated: Secondary | ICD-10-CM | POA: Diagnosis not present

## 2017-02-13 DIAGNOSIS — R61 Generalized hyperhidrosis: Secondary | ICD-10-CM | POA: Insufficient documentation

## 2017-02-13 LAB — CBC WITH DIFFERENTIAL/PLATELET
Basophils Absolute: 0 10*3/uL (ref 0.0–0.1)
Basophils Relative: 0 %
Eosinophils Absolute: 0.3 10*3/uL (ref 0.0–0.7)
Eosinophils Relative: 3 %
HEMATOCRIT: 44.2 % (ref 39.0–52.0)
HEMOGLOBIN: 15.6 g/dL (ref 13.0–17.0)
LYMPHS ABS: 4.7 10*3/uL — AB (ref 0.7–4.0)
Lymphocytes Relative: 54 %
MCH: 32.6 pg (ref 26.0–34.0)
MCHC: 35.3 g/dL (ref 30.0–36.0)
MCV: 92.3 fL (ref 78.0–100.0)
MONO ABS: 0.4 10*3/uL (ref 0.1–1.0)
MONOS PCT: 5 %
NEUTROS ABS: 3.4 10*3/uL (ref 1.7–7.7)
NEUTROS PCT: 38 %
Platelets: 241 10*3/uL (ref 150–400)
RBC: 4.79 MIL/uL (ref 4.22–5.81)
RDW: 12.7 % (ref 11.5–15.5)
WBC: 8.9 10*3/uL (ref 4.0–10.5)

## 2017-02-13 LAB — BASIC METABOLIC PANEL
Anion gap: 9 (ref 5–15)
BUN: 16 mg/dL (ref 6–20)
CALCIUM: 9.4 mg/dL (ref 8.9–10.3)
CHLORIDE: 108 mmol/L (ref 101–111)
CO2: 25 mmol/L (ref 22–32)
CREATININE: 1.08 mg/dL (ref 0.61–1.24)
GFR calc Af Amer: >60 mL/min (ref >60)
GFR calc non Af Amer: >60 mL/min (ref >60)
Glucose, Bld: 118 mg/dL — ABNORMAL HIGH (ref 65–99)
Potassium: 3.7 mmol/L (ref 3.5–5.1)
SODIUM: 142 mmol/L (ref 135–145)

## 2017-02-13 LAB — TROPONIN I: Troponin I: <0.03 ng/mL (ref <0.03)

## 2017-02-13 MED ORDER — DICLOFENAC SODIUM 75 MG PO TBEC: 75.0000 mg | DELAYED_RELEASE_TABLET | Freq: Two times a day (BID) | ORAL | 0 refills | Status: DC

## 2017-02-13 MED ORDER — ASPIRIN 81 MG PO CHEW
324.0000 mg | CHEWABLE_TABLET | Freq: Once | ORAL | Status: AC
Start: 2017-02-13 — End: 2017-02-13
  Administered 2017-02-13: 324 mg via ORAL
  Filled 2017-02-13: qty 4

## 2017-02-13 NOTE — Discharge Instructions (Signed)
Take the medicine prescribed to help reduce pain and inflammation of your chest wall.  A heating pad may also be helpful applied 20 minutes 3 times daily.  There is no evidence tonight that your symptoms are related to any heart or lung conditions. Please follow up with your primary doctor for a recheck if your symptoms persist or do not improve with this treatment.

## 2017-02-13 NOTE — ED Triage Notes (Signed)
Pt c/o left chest pain that started yesterday. Pt also sweating and sob.

## 2017-02-13 NOTE — ED Provider Notes (Signed)
[facility] DEPT Provider Note   CSN: [id number] Arrival date & time: 02/13/17  2159     History   Chief Complaint Chief Complaint  Patient presents with  . Chest Pain    HPI [name] [name] is a 41 y.o. male with past medical history outlined below, cardiac risk factors including family history (father in mid 45's and smoking status) presenting with a 24 hour history of left sided sharp, constant chest pain which is worsened with palpation, movement and deep inspiration.  the symptom started yesterday while working in the yard draining the pool, denies any heavy lifting or strenuous work at the time of sx.  He denies sob except for pain with deep inspiration.  He also had subjective fever this am with about a 5 hour history after he first woke today with sweating.  He has had a chronic nonproductive cough which he calls his "smokers cough".  He denies headache, dizziness, palpitations, n/v/ abdominal pain, leg pain or swelling.  He has had no medications for his symptoms and has found no alleviators.    The history is provided by the patient.    Past Medical History:  Diagnosis Date  . Barrett's esophagus   . Bone spur   . Bursitis of elbow    Right  . GERD (gastroesophageal reflux disease)   . H/O Clostridium difficile infection   . Hemorrhoids   . Hiatal hernia     Patient Active Problem List   Diagnosis Date Noted  . LLQ pain 09/15/2016  . Anorectal fissure 09/15/2016  . History of colonic polyps   . RUQ pain 01/29/2016  . Diarrhea 11/24/2015  . Abdominal pain 07/28/2015  . Loose stools 07/28/2015  . Reflux esophagitis   . Rectal bleeding 04/25/2013  . Barrett's esophagus 04/25/2013  . HEMATEMESIS 12/21/2010  . EPIGASTRIC PAIN 12/21/2010    Past Surgical History:  Procedure Laterality Date  . COLONOSCOPY    07/09/2007   Friable anal canal, occult fissure not excluded  Otherwise, normal rectum, colon, and terminal ileum  . COLONOSCOPY N/A 02/04/2016   Dr.  Gala Romney: tubular adenomas X2, internal hemorrhoids. GI pathogen panel negative Cdiff, +rotavirus. Next colonoscopy in 5 years.   . COLONOSCOPY WITH ESOPHAGOGASTRODUODENOSCOPY (EGD) N/A 04/26/2013   Dr. Gala Romney: colonoscopy with internal hemorrhoids otherwise normal. EGD with erosive reflux esophagitis, +Barrett's. Due for Barrett' surveillance 2015.   Marland Kitchen ESOPHAGOGASTRODUODENOSCOPY  12/23/2010   RMR: short-segment Barrett's  . ESOPHAGOGASTRODUODENOSCOPY N/A 11/28/2014   Dr.Rourk- Erosive reflux esophagitis. Abnormal distal esophagus consistent with short segment Barrett's esophagus-status post biopsy, hiatal hernia.bx= barretts esophagus.   Marland Kitchen HAND SURGERY    . HEMORRHOID BANDING  2016   Dr.Rourk  . HEMORRHOID SURGERY    . SHOULDER SURGERY    . thumb surgery         Home Medications    Prior to Admission medications   Medication Sig Start Date End Date Taking? Authorizing Provider  clonazePAM (KLONOPIN) 0.5 MG tablet Take 0.5-1 mg by mouth daily as needed for anxiety.  08/31/16  Yes [provider]  Nitroglycerin 0.4 % OINT Place 1 inch rectally every 12 (twelve) hours. For 3 weeks 09/15/16  Yes Mahala Menghini, PA-C  diclofenac (VOLTAREN) 75 MG EC tablet Take 1 tablet (75 mg total) by mouth 2 (two) times daily. 02/13/17   Evalee Jefferson, PA-C    Family History Family History  Problem Relation Age of Onset  . Colon cancer Father 55  . Colon polyps Other  sister, paternal grandfather    Social History Social History  Substance Use Topics  . Smoking status: Current Every Day Smoker    Packs/day: 1.00    Years: 30.00    Types: Cigarettes  . Smokeless tobacco: Former Systems developer     Comment: one pack daily  . Alcohol use No     Allergies   Patient has no known allergies.   Review of Systems Review of Systems  Constitutional: Positive for diaphoresis and fever.  HENT: Negative.  Negative for congestion and sore throat.   Eyes: Negative.   Respiratory: Positive for cough.  Negative for chest tightness and shortness of breath.   Cardiovascular: Positive for chest pain. Negative for palpitations and leg swelling.  Gastrointestinal: Negative for abdominal pain, nausea and vomiting.  Genitourinary: Negative.   Musculoskeletal: Negative for arthralgias, joint swelling and neck pain.  Skin: Negative.  Negative for rash and wound.  Neurological: Negative for dizziness, weakness, light-headedness, numbness and headaches.  Psychiatric/Behavioral: Negative.      Physical Exam Updated Vital Signs BP 116/74   Pulse (!) 47   Temp 98.1 F (36.7 C)   Resp 17   Ht 5\' 8"  (1.727 m)   Wt 82.6 kg   SpO2 95%   BMI 27.67 kg/m   Physical Exam  Constitutional: He appears well-developed and well-nourished.  HENT:  Head: Normocephalic and atraumatic.  Eyes: Conjunctivae are normal.  Neck: Normal range of motion.  Cardiovascular: Normal rate, regular rhythm, normal heart sounds and intact distal pulses.   Pulmonary/Chest: Effort normal and breath sounds normal. He has no wheezes. He exhibits tenderness.  ttp to palpation left mid chest wall at midclavicular line.  Abdominal: Soft. Bowel sounds are normal. He exhibits no distension. There is no tenderness. There is no guarding.  Musculoskeletal: Normal range of motion.  Neurological: He is alert.  Skin: Skin is warm and dry.  Psychiatric: He has a normal mood and affect.  Nursing note and vitals reviewed.    ED Treatments / Results  Labs (all labs ordered are listed, but only abnormal results are displayed) Labs Reviewed  BASIC METABOLIC PANEL - Abnormal; Notable for the following:       Result Value   Glucose, Bld 118 (*)    All other components within normal limits  CBC WITH DIFFERENTIAL/PLATELET - Abnormal; Notable for the following:    Lymphs Abs 4.7 (*)    All other components within normal limits  TROPONIN I    EKG  EKG Interpretation  Date/Time:  Monday Feb 13 2017 22:05:25 EDT Ventricular Rate:    57 PR Interval:    QRS Duration: 99 QT Interval:  399 QTC Calculation: 389 R Axis:   66 Text Interpretation:  Sinus arrhythmia Nonspecific T abnrm, anterolateral leads Baseline wander in lead(s) V5 No previous ECGs available Confirmed by ZACKOWSKI  MD, SCOTT ([id number]) on 02/13/2017 10:07:15 PM       Radiology Dg Chest 2 View  Result Date: 02/13/2017 CLINICAL DATA:  Chest pain today. EXAM: CHEST  2 VIEW COMPARISON:  02/23/2016 FINDINGS: The cardiomediastinal contours are normal. The lungs are clear. Pulmonary vasculature is normal. No consolidation, pleural effusion, or pneumothorax. No acute osseous abnormalities are seen. IMPRESSION: No acute pulmonary process. Electronically Signed   By: Jeb Levering M.D.   On: 02/13/2017 23:02    Procedures Procedures (including critical care time)  Medications Ordered in ED Medications  aspirin chewable tablet 324 mg (324 mg Oral Given 02/13/17 2252)  Initial Impression / Assessment and Plan / ED Course  I have reviewed the triage vital signs and the nursing notes.  Pertinent labs & imaging results that were available during my care of the patient were reviewed by me and considered in my medical decision making (see chart for details).     Pt with reproducible chest wall pain, negative troponin, no acute findings on ekg.  Doubt PE, cxr clear, doubt infectious process.  Discussed that patient would benefit from stress testing given fathers cardiac hx.  Advised f/u with pcp to discuss.  Also discussed smoking cessation.  Pt does not seem interested in this idea.  Strict return precautions discussed.    Final Clinical Impressions(s) / ED Diagnoses   Final diagnoses:  Chest wall pain    New Prescriptions Discharge Medication List as of 02/13/2017 11:45 PM    START taking these medications   Details  diclofenac (VOLTAREN) 75 MG EC tablet Take 1 tablet (75 mg total) by mouth 2 (two) times daily., Starting Mon 02/13/2017, Print          Evalee Jefferson, PA-C 02/14/17 0018    Fredia Sorrow, MD 02/21/17 [phone]

## 2017-02-14 ENCOUNTER — Telehealth: Payer: Self-pay | Admitting: Cardiology

## 2017-02-14 NOTE — Progress Notes (Signed)
Cardiology Office Note   Date:  02/16/2017   ID:  [name] [name], DOB 1976-01-14, MRN [id number]  PCP:  Heywood Bene, PA-C  Cardiologist:   Minus Breeding, MD  Referring:  Heywood Bene, PA-C  Chief Complaint  Patient presents with  . Chest Pain      History of Present Illness: [name] [name] is a 41 y.o. male who was referred by Heywood Bene, PA-C for evaluation of chest pain.    He was in the ED with this on 5/7 and was thought to have chest wall pain.  I reviewed these records for this visit.  He said that on Friday last week hard work on his truck.   The next day she was out in his yard not doing particularly heavy work. He developed a knife like chest discomfort.  This was 8 out of 10. It was at a point in the left side of his chest. His hands were tingling. It was worse with deep breathing. At home his blood pressure was 190/100 when it was checked by the time he got to the emergency room where he drove himself it was back to normal. However, he's continued to have 4 out of 10 constant discomfort that is somewhat worse with deep breathing. He's not having any neck or arm discomfort. He's not had any cough fevers or chills. He's not had any palpitations, presyncope or syncope. He's never had chest discomfort like this before.   Past Medical History:  Diagnosis Date  . Barrett's esophagus   . Bone spur   . Bursitis of elbow    Right  . GERD (gastroesophageal reflux disease)   . H/O Clostridium difficile infection   . Hemorrhoids   . Hiatal hernia     Past Surgical History:  Procedure Laterality Date  . COLONOSCOPY    07/09/2007   Friable anal canal, occult fissure not excluded  Otherwise, normal rectum, colon, and terminal ileum  . COLONOSCOPY N/A 02/04/2016   Dr. Gala Romney: tubular adenomas X2, internal hemorrhoids. GI pathogen panel negative Cdiff, +rotavirus. Next colonoscopy in 5 years.   . COLONOSCOPY WITH ESOPHAGOGASTRODUODENOSCOPY (EGD) N/A  04/26/2013   Dr. Gala Romney: colonoscopy with internal hemorrhoids otherwise normal. EGD with erosive reflux esophagitis, +Barrett's. Due for Barrett' surveillance 2015.   Marland Kitchen ESOPHAGOGASTRODUODENOSCOPY  12/23/2010   RMR: short-segment Barrett's  . ESOPHAGOGASTRODUODENOSCOPY N/A 11/28/2014   Dr.Rourk- Erosive reflux esophagitis. Abnormal distal esophagus consistent with short segment Barrett's esophagus-status post biopsy, hiatal hernia.bx= barretts esophagus.   Marland Kitchen HAND SURGERY    . HEMORRHOID BANDING  2016   Dr.Rourk  . HEMORRHOID SURGERY    . SHOULDER SURGERY    . thumb surgery       Current Outpatient Prescriptions  Medication Sig Dispense Refill  . clonazePAM (KLONOPIN) 0.5 MG tablet Take 0.5-1 mg by mouth daily as needed for anxiety.     . diclofenac (VOLTAREN) 75 MG EC tablet Take 1 tablet (75 mg total) by mouth 2 (two) times daily. 20 tablet 0  . varenicline (CHANTIX CONTINUING MONTH PAK) 1 MG tablet Take 1 tablet (1 mg total) by mouth 2 (two) times daily. 60 tablet 2  . varenicline (CHANTIX STARTING MONTH PAK) 0.5 MG X 11 & 1 MG X 42 tablet Take one 0.5 mg tablet by mouth once daily for 3 days, then increase to one 0.5 mg tablet twice daily for 4 days, then increase to one 1 mg tablet twice daily. 53 tablet 0  No current facility-administered medications for this visit.     Allergies:   Patient has no known allergies.    Social History:  The patient  reports that he has been smoking Cigarettes.  He has a 30.00 pack-year smoking history. He has quit using smokeless tobacco. He reports that he uses drugs, including Marijuana. He reports that he does not drink alcohol.   Family History:  The patient's family history includes COPD in his mother; Colon cancer (age of onset: 66) in his father; Colon polyps in his other; Diabetes in his father; Emphysema in his mother; Heart disease (age of onset: 55) in his father; Heart murmur in his father; Post-traumatic stress disorder in his father.     ROS:  Please see the history of present illness.   Otherwise, review of systems are positive for none.   All other systems are reviewed and negative.    PHYSICAL EXAM: VS:  BP 98/78 (BP Location: Right Arm, Patient Position: Sitting, Cuff Size: Normal)   Pulse 72   Ht 5\' 10"  (1.778 m)   Wt 179 lb (81.2 kg)   BMI 25.68 kg/m  , BMI Body mass index is 25.68 kg/m. GENERAL:  Well appearing HEENT:  Pupils equal round and reactive, fundi not visualized, oral mucosa unremarkable NECK:  No jugular venous distention, waveform within normal limits, carotid upstroke brisk and symmetric, no bruits, no thyromegaly LYMPHATICS:  No cervical, inguinal adenopathy LUNGS:  Clear to auscultation bilaterally BACK:  No CVA tenderness CHEST:  Unremarkable HEART:  PMI not displaced or sustained,S1 and S2 within normal limits, no S3, no S4, no clicks, no rubs, no murmurs ABD:  Flat, positive bowel sounds normal in frequency in pitch, no bruits, no rebound, no guarding, no midline pulsatile mass, no hepatomegaly, no splenomegaly EXT:  2 plus pulses throughout, no edema, no cyanosis no clubbing SKIN:  No rashes no nodules NEURO:  Cranial nerves II through XII grossly intact, motor grossly intact throughout PSYCH:  Cognitively intact, oriented to person place and time    EKG:  EKG is not ordered today. The ekg ordered 02/13/17 demonstrates sinus rhythm, rate 57, sinus arrhythmia, axis within normal limits, intervals within normal limits, no acute ST-T wave changes.  Recent Labs: 11/23/2016: ALT 36 02/13/2017: BUN 16; Creatinine, Ser 1.08; Hemoglobin 15.6; Platelets 241; Potassium 3.7; Sodium 142    Lipid Panel No results found for: CHOL, TRIG, HDL, CHOLHDL, VLDL, LDLCALC, LDLDIRECT    Wt Readings from Last 3 Encounters:  02/15/17 179 lb (81.2 kg)  02/13/17 182 lb (82.6 kg)  11/23/16 184 lb (83.5 kg)      Other studies Reviewed: Additional studies/ records that were reviewed today include: ED  records. Review of the above records demonstrates:  Please see elsewhere in the note.     ASSESSMENT AND PLAN:   CHEST PAIN:    The chest pain is atypical and likely musculoskeletal. However, he has significant cardiovascular risk factors. I will bring the patient back for a POET (Plain Old Exercise Test). This will allow me to screen for obstructive coronary disease, risk stratify and very importantly provide a prescription for exercise.  TOBACCO:  He does want to try Chantix.  We discussed all of the potential side effects and in particular I reviewed the [employer].  He has no depression.  He understands and wishes to try this medication.  RISK REDUCTION:  I will order a lipid profile when he returns.    Current medicines are reviewed  at length with the patient today.  The patient does not have concerns regarding medicines.  The following changes have been made:  As above.   Labs/ tests ordered today include:   Orders Placed This Encounter  Procedures  . Lipid panel  . EXERCISE TOLERANCE TEST     Disposition:   FU with me as needed.      Signed, Minus Breeding, MD  02/16/2017 12:53 PM    [facility]

## 2017-02-14 NOTE — Telephone Encounter (Signed)
Received records from [facility] at [facility] for appointment on 02/15/17 with Dr Percival Spanish.  Records put with Dr Hochrein's schedule for 02/15/17. lp

## 2017-02-15 ENCOUNTER — Ambulatory Visit (INDEPENDENT_AMBULATORY_CARE_PROVIDER_SITE_OTHER): Payer: Federal, State, Local not specified - PPO | Admitting: Cardiology

## 2017-02-15 ENCOUNTER — Encounter: Payer: Self-pay | Admitting: Cardiology

## 2017-02-15 VITALS — BP 98/78 | HR 72 | Ht 70.0 in | Wt 179.0 lb

## 2017-02-15 DIAGNOSIS — Z720 Tobacco use: Secondary | ICD-10-CM

## 2017-02-15 DIAGNOSIS — Z13220 Encounter for screening for lipoid disorders: Secondary | ICD-10-CM

## 2017-02-15 DIAGNOSIS — R0602 Shortness of breath: Secondary | ICD-10-CM | POA: Diagnosis not present

## 2017-02-15 DIAGNOSIS — R0789 Other chest pain: Secondary | ICD-10-CM

## 2017-02-15 MED ORDER — VARENICLINE TARTRATE 1 MG PO TABS: 1.0000 mg | ORAL_TABLET | Freq: Two times a day (BID) | ORAL | 2 refills | Status: DC

## 2017-02-15 MED ORDER — VARENICLINE TARTRATE 0.5 MG X 11 & 1 MG X 42 PO MISC: ORAL | 0 refills | Status: DC

## 2017-02-15 NOTE — Patient Instructions (Signed)
Medication Instructions:  START- Chantix  Labwork: Fasting Lipids  Testing/Procedures: Your physician has requested that you have an exercise tolerance test. For further information please visit [email or web address]. Please also follow instruction sheet, as given.   Follow-Up: Your physician recommends that you schedule a follow-up appointment in: As Needed   Any Other Special Instructions Will Be Listed Below (If Applicable).   If you need a refill on your cardiac medications before your next appointment, please call your pharmacy.

## 2017-02-16 ENCOUNTER — Encounter: Payer: Self-pay | Admitting: Cardiology

## 2017-02-16 DIAGNOSIS — Z13220 Encounter for screening for lipoid disorders: Secondary | ICD-10-CM | POA: Insufficient documentation

## 2017-02-16 DIAGNOSIS — R0602 Shortness of breath: Secondary | ICD-10-CM | POA: Insufficient documentation

## 2017-02-16 DIAGNOSIS — R0789 Other chest pain: Secondary | ICD-10-CM | POA: Insufficient documentation

## 2017-02-22 ENCOUNTER — Telehealth (HOSPITAL_COMMUNITY): Payer: Self-pay

## 2017-02-22 NOTE — Telephone Encounter (Signed)
Encounter complete. 

## 2017-02-24 ENCOUNTER — Ambulatory Visit (HOSPITAL_COMMUNITY)
Admission: RE | Admit: 2017-02-24 | Discharge: 2017-02-24 | Disposition: A | Discharge status: Home / Self Care | Payer: Federal, State, Local not specified - PPO | Source: Ambulatory Visit | Attending: Internal Medicine | Admitting: Internal Medicine

## 2017-02-24 DIAGNOSIS — R0602 Shortness of breath: Secondary | ICD-10-CM

## 2017-02-24 DIAGNOSIS — R0789 Other chest pain: Secondary | ICD-10-CM | POA: Diagnosis not present

## 2017-02-24 LAB — EXERCISE TOLERANCE TEST
CSEPED: 10 min
CSEPEW: 13.2 METS
Exercise duration (sec): 55 s
MPHR: 179 {beats}/min
Peak HR: 166 {beats}/min
Percent HR: 92 %
RPE: 18
Rest HR: 51 {beats}/min

## 2017-02-24 LAB — LIPID PANEL
Cholesterol: 190 mg/dL (ref <200)
HDL: 35 mg/dL — ABNORMAL LOW (ref >40)
LDL CALC: 127 mg/dL — AB (ref <100)
TRIGLYCERIDES: 139 mg/dL (ref <150)
Total CHOL/HDL Ratio: 5.4 Ratio — ABNORMAL HIGH (ref <5.0)
VLDL: 28 mg/dL (ref <30)

## 2017-05-18 ENCOUNTER — Telehealth: Payer: Self-pay | Admitting: Internal Medicine

## 2017-05-18 NOTE — Telephone Encounter (Signed)
[name] [name], we have not seen the pt since 09/2016. Do we need to see him in the office?

## 2017-05-18 NOTE — Telephone Encounter (Signed)
I spoke with the patient and made him aware of Leslie's recommendations, he doesn't rember who referred him to [facility], however he would like to have [place] as his GI doctor.  He is scheduled to see EG 8/14 at 10:30 am.

## 2017-05-18 NOTE — Telephone Encounter (Signed)
Pt said he is having rectal bleeding again and needs a prescription called into [place], He said he has gotten suppositories and cream in the past and that's would he wants again.

## 2017-05-18 NOTE — Telephone Encounter (Signed)
Patient has been to [facility] and surgery with hemorrhoid banding since we last saw him in 09/2016.  We did not refer to [facility].   Patient needs evaluation. We can make appt here or he can make appt at [facility] but he needs to understand that for continuity of care he should not see both [place] and them.   If significant bleeding of course he should go to ed for evaluation.

## 2017-05-23 ENCOUNTER — Ambulatory Visit: Payer: Federal, State, Local not specified - PPO | Admitting: Gastroenterology

## 2017-05-23 ENCOUNTER — Ambulatory Visit: Payer: Federal, State, Local not specified - PPO | Admitting: Nurse Practitioner

## 2017-07-14 ENCOUNTER — Ambulatory Visit: Payer: Federal, State, Local not specified - PPO | Admitting: Gastroenterology

## 2017-10-17 ENCOUNTER — Encounter: Payer: Self-pay | Admitting: Internal Medicine

## 2018-10-24 ENCOUNTER — Ambulatory Visit: Payer: Federal, State, Local not specified - PPO | Admitting: Orthopaedic Surgery

## 2018-10-24 ENCOUNTER — Encounter: Payer: Self-pay | Admitting: Orthopaedic Surgery

## 2018-10-24 ENCOUNTER — Ambulatory Visit (HOSPITAL_COMMUNITY)
Admission: RE | Admit: 2018-10-24 | Discharge: 2018-10-24 | Disposition: A | Discharge status: Home / Self Care | Payer: Federal, State, Local not specified - PPO | Source: Ambulatory Visit | Attending: Orthopaedic Surgery | Admitting: Orthopaedic Surgery

## 2018-10-24 VITALS — BP 128/88 | HR 71 | Ht 70.0 in | Wt 181.0 lb

## 2018-10-24 DIAGNOSIS — M79661 Pain in right lower leg: Secondary | ICD-10-CM | POA: Insufficient documentation

## 2018-10-24 NOTE — Progress Notes (Signed)
Subjective:    Patient ID: [name] [name], male    DOB: 1976-08-11, 43 y.o.   MRN: [id number]  HPI Over the last week he has had pain in the right calf and has developed swelling of the calf and pain when walking and pain when dorsiflexing his foot.  He has no trauma.  He has noticed his calf is swollen.  He has no redness.  He drove recently to [place], 16 hour drive each way. He has no knee pain, no knee swelling.    I am concerned about a blood clot.  I will get stat Doppler study.   Review of Systems  Constitutional: Positive for activity change.  Musculoskeletal: Positive for arthralgias and gait problem.  All other systems reviewed and are negative.  For Review of Systems, all other systems reviewed and are negative.  The following is a summary of the past history medically, past history surgically, known current medicines, social history and family history.  This information is gathered electronically by the computer from prior information and documentation.  I review this each visit and have found including this information at this point in the chart is beneficial and informative.   Past Medical History:  Diagnosis Date  . Barrett's esophagus   . Bone spur   . Bursitis of elbow    Right  . GERD (gastroesophageal reflux disease)   . H/O Clostridium difficile infection   . Hemorrhoids   . Hiatal hernia     Past Surgical History:  Procedure Laterality Date  . COLONOSCOPY    07/09/2007   Friable anal canal, occult fissure not excluded  Otherwise, normal rectum, colon, and terminal ileum  . COLONOSCOPY N/A 02/04/2016   Dr. Gala Romney: tubular adenomas X2, internal hemorrhoids. GI pathogen panel negative Cdiff, +rotavirus. Next colonoscopy in 5 years.   . COLONOSCOPY WITH ESOPHAGOGASTRODUODENOSCOPY (EGD) N/A 04/26/2013   Dr. Gala Romney: colonoscopy with internal hemorrhoids otherwise normal. EGD with erosive reflux esophagitis, +Barrett's. Due for Barrett' surveillance 2015.   Marland Kitchen  ESOPHAGOGASTRODUODENOSCOPY  12/23/2010   RMR: short-segment Barrett's  . ESOPHAGOGASTRODUODENOSCOPY N/A 11/28/2014   Dr.Rourk- Erosive reflux esophagitis. Abnormal distal esophagus consistent with short segment Barrett's esophagus-status post biopsy, hiatal hernia.bx= barretts esophagus.   Marland Kitchen HAND SURGERY    . HEMORRHOID BANDING  2016   Dr.Rourk  . HEMORRHOID SURGERY    . SHOULDER SURGERY    . thumb surgery      Current Outpatient Medications on File Prior to Visit  Medication Sig Dispense Refill  . clonazePAM (KLONOPIN) 0.5 MG tablet Take 0.5-1 mg by mouth daily as needed for anxiety.     . diclofenac (VOLTAREN) 75 MG EC tablet Take 1 tablet (75 mg total) by mouth 2 (two) times daily. 20 tablet 0  . varenicline (CHANTIX CONTINUING MONTH PAK) 1 MG tablet Take 1 tablet (1 mg total) by mouth 2 (two) times daily. 60 tablet 2  . varenicline (CHANTIX STARTING MONTH PAK) 0.5 MG X 11 & 1 MG X 42 tablet Take one 0.5 mg tablet by mouth once daily for 3 days, then increase to one 0.5 mg tablet twice daily for 4 days, then increase to one 1 mg tablet twice daily. 53 tablet 0   No current facility-administered medications on file prior to visit.     Social History   Socioeconomic History  . Marital status: Married    Spouse name: Not on file  . Number of children: 1  . Years of education: Not on file  .  Highest education level: Not on file  Occupational History  . Not on file  Social Needs  . Financial resource strain: Not on file  . Food insecurity:    Worry: Not on file    Inability: Not on file  . Transportation needs:    Medical: Not on file    Non-medical: Not on file  Tobacco Use  . Smoking status: Current Every Day Smoker    Packs/day: 1.00    Years: 30.00    Pack years: 30.00    Types: Cigarettes  . Smokeless tobacco: Former Systems developer  . Tobacco comment: one pack daily  Substance and Sexual Activity  . Alcohol use: No    Alcohol/week: 0.0 standard drinks  . Drug use: Yes     Types: Marijuana    Comment: occasional use  . Sexual activity: Never  Lifestyle  . Physical activity:    Days per week: Not on file    Minutes per session: Not on file  . Stress: Not on file  Relationships  . Social connections:    Talks on phone: Not on file    Gets together: Not on file    Attends religious service: Not on file    Active member of club or organization: Not on file    Attends meetings of clubs or organizations: Not on file    Relationship status: Not on file  . Intimate partner violence:    Fear of current or ex partner: Not on file    Emotionally abused: Not on file    Physically abused: Not on file    Forced sexual activity: Not on file  Other Topics Concern  . Not on file  Social History Narrative   Pipe fitter.      Family History  Problem Relation Age of Onset  . Colon cancer Father 50  . Heart disease Father 58       Stents.    . Heart murmur Father   . Diabetes Father   . Post-traumatic stress disorder Father   . Colon polyps Other        sister, paternal grandfather  . COPD Mother   . Emphysema Mother     BP 128/88   Pulse 71   Ht 5\' 10"  (1.778 m)   Wt 181 lb (82.1 kg)   BMI 25.97 kg/m   Body mass index is 25.97 kg/m.'    Objective:   Physical Exam Constitutional:      Appearance: He is well-developed.  HENT:     Head: Normocephalic and atraumatic.  Eyes:     Conjunctiva/sclera: Conjunctivae normal.     Pupils: Pupils are equal, round, and reactive to light.  Neck:     Musculoskeletal: Normal range of motion and neck supple.  Cardiovascular:     Rate and Rhythm: Normal rate and regular rhythm.  Pulmonary:     Effort: Pulmonary effort is normal.  Abdominal:     Palpations: Abdomen is soft.  Musculoskeletal:       Legs:  Skin:    General: Skin is warm and dry.  Neurological:     Mental Status: He is alert and oriented to person, place, and time.     Cranial Nerves: No cranial nerve deficit.     Motor: No abnormal  muscle tone.     Coordination: Coordination normal.     Deep Tendon Reflexes: Reflexes are normal and symmetric. Reflexes normal.  Psychiatric:        Behavior: Behavior  normal.        Thought Content: Thought content normal.        Judgment: Judgment normal.           Assessment & Plan:   Encounter Diagnosis  Name Primary?  . Pain of right calf Yes   I am concerned about a DVT of the right calf.  He has positive Homan sign and did have a long trip driving with sitting for prolonged periods.  I have ordered a stat Doppler study.  He is to stay at hospital until report is called here.  If positive he will be treated, if negative I will see him back here.  He understands.  Addendum: He had the Doppler and it was negative.  He returned to the office.   He does not want oral medicines.  I have told him about using Aspercreme or BioFreeze to the area.  He is to stay off work rest of this week.  Return in one week.  If not improved, get MRI.  Call if any problem.  Precautions discussed.   Electronically Signed Sanjuana Kava, MD 1/15/20202:47 PM

## 2018-10-31 ENCOUNTER — Encounter: Payer: Self-pay | Admitting: Orthopaedic Surgery

## 2018-10-31 ENCOUNTER — Ambulatory Visit (INDEPENDENT_AMBULATORY_CARE_PROVIDER_SITE_OTHER): Payer: Federal, State, Local not specified - PPO | Admitting: Orthopaedic Surgery

## 2018-10-31 VITALS — BP 121/81 | HR 75 | Ht 68.0 in | Wt 183.0 lb

## 2018-10-31 DIAGNOSIS — M79661 Pain in right lower leg: Secondary | ICD-10-CM

## 2018-10-31 NOTE — Progress Notes (Signed)
Patient MW:UXLKG [name] [name], male DOB:November 29, 1975, 43 y.o. MWN:[id number]  Chief Complaint  Patient presents with  . Leg Pain    right     HPI  [name] [name] is a 43 y.o. male who has worsening pain of the right calf.  He has more swelling and spasm at times.  His Doppler was negative.  I am concerned about a tear of the gastrocnemius muscle.  I would like to get a MRI of the area.   Body mass index is 27.83 kg/m.  ROS  Review of Systems  Constitutional: Positive for activity change.  Musculoskeletal: Positive for arthralgias and gait problem.  All other systems reviewed and are negative.   All other systems reviewed and are negative.  The following is a summary of the past history medically, past history surgically, known current medicines, social history and family history.  This information is gathered electronically by the computer from prior information and documentation.  I review this each visit and have found including this information at this point in the chart is beneficial and informative.    Past Medical History:  Diagnosis Date  . Barrett's esophagus   . Bone spur   . Bursitis of elbow    Right  . GERD (gastroesophageal reflux disease)   . H/O Clostridium difficile infection   . Hemorrhoids   . Hiatal hernia     Past Surgical History:  Procedure Laterality Date  . COLONOSCOPY    07/09/2007   Friable anal canal, occult fissure not excluded  Otherwise, normal rectum, colon, and terminal ileum  . COLONOSCOPY N/A 02/04/2016   Dr. Gala Romney: tubular adenomas X2, internal hemorrhoids. GI pathogen panel negative Cdiff, +rotavirus. Next colonoscopy in 5 years.   . COLONOSCOPY WITH ESOPHAGOGASTRODUODENOSCOPY (EGD) N/A 04/26/2013   Dr. Gala Romney: colonoscopy with internal hemorrhoids otherwise normal. EGD with erosive reflux esophagitis, +Barrett's. Due for Barrett' surveillance 2015.   Marland Kitchen ESOPHAGOGASTRODUODENOSCOPY  12/23/2010   RMR: short-segment Barrett's  .  ESOPHAGOGASTRODUODENOSCOPY N/A 11/28/2014   Dr.Rourk- Erosive reflux esophagitis. Abnormal distal esophagus consistent with short segment Barrett's esophagus-status post biopsy, hiatal hernia.bx= barretts esophagus.   Marland Kitchen HAND SURGERY    . HEMORRHOID BANDING  2016   Dr.Rourk  . HEMORRHOID SURGERY    . SHOULDER SURGERY    . thumb surgery      Family History  Problem Relation Age of Onset  . Colon cancer Father 110  . Heart disease Father 51       Stents.    . Heart murmur Father   . Diabetes Father   . Post-traumatic stress disorder Father   . Colon polyps Other        sister, paternal grandfather  . COPD Mother   . Emphysema Mother     Social History Social History   Tobacco Use  . Smoking status: Current Every Day Smoker    Packs/day: 1.00    Years: 30.00    Pack years: 30.00    Types: Cigarettes  . Smokeless tobacco: Former Systems developer  . Tobacco comment: one pack daily  Substance Use Topics  . Alcohol use: No    Alcohol/week: 0.0 standard drinks  . Drug use: Yes    Types: Marijuana    Comment: occasional use    No Known Allergies  No current outpatient medications on file.   No current facility-administered medications for this visit.      Physical Exam  Blood pressure 121/81, pulse 75, height 5\' 8"  (1.727 m), weight 183 lb (  83 kg).  Constitutional: overall normal hygiene, normal nutrition, well developed, normal grooming, normal body habitus. Assistive device:none  Musculoskeletal: gait and station Limp right, muscle tone and strength are normal, no tremors or atrophy is present.  .  Neurological: coordination overall normal.  Deep tendon reflex/nerve stretch intact.  Sensation normal.  Cranial nerves II-XII intact.   Skin:   Normal overall no scars, lesions, ulcers or rashes. No psoriasis.  Psychiatric: Alert and oriented x 3.  Recent memory intact, remote memory unclear.  Normal mood and affect. Well groomed.  Good eye contact.  Cardiovascular: overall no  swelling, no varicosities, no edema bilaterally, normal temperatures of the legs and arms, no clubbing, cyanosis and good capillary refill.  Lymphatic: palpation is normal.  Right calf is tender more medially, he had swelling and pain and limp to the right.  He has no spasm now.  NV intact.  I do not feel a defect but he is very tender medially and more mid calf to proximal area.  He has no redness, no ecchymosis.  All other systems reviewed and are negative   The patient has been educated about the nature of the problem(s) and counseled on treatment options.  The patient appeared to understand what I have discussed and is in agreement with it.  Encounter Diagnosis  Name Primary?  . Pain of right calf Yes    PLAN Call if any problems.  Precautions discussed.  Continue current medications.   Return to clinic get MRI of the right calf   Electronically Signed Sanjuana Kava, MD 1/22/20208:10 AM

## 2018-11-07 ENCOUNTER — Ambulatory Visit (HOSPITAL_COMMUNITY)
Admission: RE | Admit: 2018-11-07 | Discharge: 2018-11-07 | Disposition: A | Discharge status: Home / Self Care | Payer: Federal, State, Local not specified - PPO | Source: Ambulatory Visit | Attending: Orthopaedic Surgery | Admitting: Orthopaedic Surgery

## 2018-11-07 DIAGNOSIS — M79661 Pain in right lower leg: Secondary | ICD-10-CM | POA: Diagnosis present

## 2018-11-08 ENCOUNTER — Ambulatory Visit (INDEPENDENT_AMBULATORY_CARE_PROVIDER_SITE_OTHER): Payer: Federal, State, Local not specified - PPO | Admitting: Orthopaedic Surgery

## 2018-11-08 ENCOUNTER — Encounter: Payer: Self-pay | Admitting: Orthopaedic Surgery

## 2018-11-08 VITALS — BP 126/79 | HR 57 | Ht 68.0 in | Wt 181.0 lb

## 2018-11-08 DIAGNOSIS — M79661 Pain in right lower leg: Secondary | ICD-10-CM | POA: Diagnosis not present

## 2018-11-08 NOTE — Progress Notes (Signed)
Patient TG:GYIRS [name] [name], male DOB:May 20, 1976, 43 y.o. [id number]:[id number]  Chief Complaint  Patient presents with  . Leg Pain    HPI  [name] [name] is a 43 y.o. male who has pain in the right calf. He had a MRI of the area which has returned normal.  I have explained the findings to him.  He is a little better. He has been using the mustard for the spasm and it has really helped.  He is walking better.  I have offered PT but he says he will treat at home.  I will see him as needed.   Body mass index is 27.52 kg/m.  ROS  Review of Systems  Constitutional: Positive for activity change.  Musculoskeletal: Positive for arthralgias and gait problem.  All other systems reviewed and are negative.   All other systems reviewed and are negative.  The following is a summary of the past history medically, past history surgically, known current medicines, social history and family history.  This information is gathered electronically by the computer from prior information and documentation.  I review this each visit and have found including this information at this point in the chart is beneficial and informative.    Past Medical History:  Diagnosis Date  . Barrett's esophagus   . Bone spur   . Bursitis of elbow    Right  . GERD (gastroesophageal reflux disease)   . H/O Clostridium difficile infection   . Hemorrhoids   . Hiatal hernia     Past Surgical History:  Procedure Laterality Date  . COLONOSCOPY    07/09/2007   Friable anal canal, occult fissure not excluded  Otherwise, normal rectum, colon, and terminal ileum  . COLONOSCOPY N/A 02/04/2016   Dr. Gala Romney: tubular adenomas X2, internal hemorrhoids. GI pathogen panel negative Cdiff, +rotavirus. Next colonoscopy in 5 years.   . COLONOSCOPY WITH ESOPHAGOGASTRODUODENOSCOPY (EGD) N/A 04/26/2013   Dr. Gala Romney: colonoscopy with internal hemorrhoids otherwise normal. EGD with erosive reflux esophagitis, +Barrett's. Due for Barrett' surveillance  2015.   Marland Kitchen ESOPHAGOGASTRODUODENOSCOPY  12/23/2010   RMR: short-segment Barrett's  . ESOPHAGOGASTRODUODENOSCOPY N/A 11/28/2014   Dr.Rourk- Erosive reflux esophagitis. Abnormal distal esophagus consistent with short segment Barrett's esophagus-status post biopsy, hiatal hernia.bx= barretts esophagus.   Marland Kitchen HAND SURGERY    . HEMORRHOID BANDING  2016   Dr.Rourk  . HEMORRHOID SURGERY    . SHOULDER SURGERY    . thumb surgery      Family History  Problem Relation Age of Onset  . Colon cancer Father 53  . Heart disease Father 44       Stents.    . Heart murmur Father   . Diabetes Father   . Post-traumatic stress disorder Father   . Colon polyps Other        sister, paternal grandfather  . COPD Mother   . Emphysema Mother     Social History Social History   Tobacco Use  . Smoking status: Current Every Day Smoker    Packs/day: 1.00    Years: 30.00    Pack years: 30.00    Types: Cigarettes  . Smokeless tobacco: Former Systems developer  . Tobacco comment: one pack daily  Substance Use Topics  . Alcohol use: No    Alcohol/week: 0.0 standard drinks  . Drug use: Yes    Types: Marijuana    Comment: occasional use    No Known Allergies  No current outpatient medications on file.   No current facility-administered medications for this  visit.      Physical Exam  Blood pressure 126/79, pulse (!) 57, height 5\' 8"  (1.727 m), weight 181 lb (82.1 kg).  Constitutional: overall normal hygiene, normal nutrition, well developed, normal grooming, normal body habitus. Assistive device:none  Musculoskeletal: gait and station Limp none, muscle tone and strength are normal, no tremors or atrophy is present.  .  Neurological: coordination overall normal.  Deep tendon reflex/nerve stretch intact.  Sensation normal.  Cranial nerves II-XII intact.   Skin:   Normal overall no scars, lesions, ulcers or rashes. No psoriasis.  Psychiatric: Alert and oriented x 3.  Recent memory intact, remote memory  unclear.  Normal mood and affect. Well groomed.  Good eye contact.  Cardiovascular: overall no swelling, no varicosities, no edema bilaterally, normal temperatures of the legs and arms, no clubbing, cyanosis and good capillary refill.  Lymphatic: palpation is normal.  Right calf is tender more medially but much less so than before.  He is walking well.  He has no defect.  Swelling has gone.  All other systems reviewed and are negative   The patient has been educated about the nature of the problem(s) and counseled on treatment options.  The patient appeared to understand what I have discussed and is in agreement with it.  Encounter Diagnosis  Name Primary?  . Pain of right calf Yes    PLAN Call if any problems.  Precautions discussed.  Continue current medications.   Return to clinic prn   Electronically Signed Sanjuana Kava, MD 1/30/20208:19 AM

## 2019-04-23 ENCOUNTER — Ambulatory Visit: Payer: Federal, State, Local not specified - PPO | Admitting: Orthopaedic Surgery

## 2019-04-23 ENCOUNTER — Encounter: Payer: Self-pay | Admitting: Orthopaedic Surgery

## 2019-04-23 ENCOUNTER — Ambulatory Visit (INDEPENDENT_AMBULATORY_CARE_PROVIDER_SITE_OTHER): Payer: Federal, State, Local not specified - PPO

## 2019-04-23 ENCOUNTER — Other Ambulatory Visit: Payer: Self-pay

## 2019-04-23 VITALS — BP 136/94 | HR 69 | Temp 97.5°F | Ht 68.0 in | Wt 178.0 lb

## 2019-04-23 DIAGNOSIS — M545 Low back pain, unspecified: Secondary | ICD-10-CM

## 2019-04-23 DIAGNOSIS — G5602 Carpal tunnel syndrome, left upper limb: Secondary | ICD-10-CM | POA: Diagnosis not present

## 2019-04-23 DIAGNOSIS — F17210 Nicotine dependence, cigarettes, uncomplicated: Secondary | ICD-10-CM

## 2019-04-23 MED ORDER — PREDNISONE 5 MG (21) PO TBPK: ORAL_TABLET | ORAL | 0 refills | Status: DC

## 2019-04-23 MED ORDER — TIZANIDINE HCL 4 MG PO TABS: ORAL_TABLET | ORAL | 3 refills | Status: DC

## 2019-04-23 MED ORDER — HYDROCODONE-ACETAMINOPHEN 5-325 MG PO TABS: ORAL_TABLET | ORAL | 0 refills | Status: DC

## 2019-04-23 NOTE — Progress Notes (Signed)
Patient BD:ZHGDJ [name] [name], male DOB:Sep 17, 1976, 43 y.o. [id number]  Chief Complaint  Patient presents with  . Back Pain    low back/numbness in hands/ sharp constant pain    HPI  [name] [name] is a 43 y.o. male who has developed pain in the lumbar area mid line with no radiation.  It began about a month ago.  He has no trauma, no weakness, no numbness.  It just hurts and he has problems sleeping. Advil, rubs and ice have not helped.  He also has numbness of both hands, worse at night,more on the left.  He has no trauma.  It wakes him up and they get better after shaking them.   Body mass index is 27.06 kg/m.  ROS  Review of Systems  Constitutional: Positive for activity change.  Musculoskeletal: Positive for arthralgias and gait problem.  All other systems reviewed and are negative.   All other systems reviewed and are negative.  The following is a summary of the past history medically, past history surgically, known current medicines, social history and family history.  This information is gathered electronically by the computer from prior information and documentation.  I review this each visit and have found including this information at this point in the chart is beneficial and informative.    Past Medical History:  Diagnosis Date  . Barrett's esophagus   . Bone spur   . Bursitis of elbow    Right  . GERD (gastroesophageal reflux disease)   . H/O Clostridium difficile infection   . Hemorrhoids   . Hiatal hernia     Past Surgical History:  Procedure Laterality Date  . COLONOSCOPY    07/09/2007   Friable anal canal, occult fissure not excluded  Otherwise, normal rectum, colon, and terminal ileum  . COLONOSCOPY N/A 02/04/2016   Dr. Gala Romney: tubular adenomas X2, internal hemorrhoids. GI pathogen panel negative Cdiff, +rotavirus. Next colonoscopy in 5 years.   . COLONOSCOPY WITH ESOPHAGOGASTRODUODENOSCOPY (EGD) N/A 04/26/2013   Dr. Gala Romney: colonoscopy with internal  hemorrhoids otherwise normal. EGD with erosive reflux esophagitis, +Barrett's. Due for Barrett' surveillance 2015.   Marland Kitchen ESOPHAGOGASTRODUODENOSCOPY  12/23/2010   RMR: short-segment Barrett's  . ESOPHAGOGASTRODUODENOSCOPY N/A 11/28/2014   Dr.Rourk- Erosive reflux esophagitis. Abnormal distal esophagus consistent with short segment Barrett's esophagus-status post biopsy, hiatal hernia.bx= barretts esophagus.   Marland Kitchen HAND SURGERY    . HEMORRHOID BANDING  2016   Dr.Rourk  . HEMORRHOID SURGERY    . SHOULDER SURGERY    . thumb surgery      Family History  Problem Relation Age of Onset  . Colon cancer Father 67  . Heart disease Father 78       Stents.    . Heart murmur Father   . Diabetes Father   . Post-traumatic stress disorder Father   . Colon polyps Other        sister, paternal grandfather  . COPD Mother   . Emphysema Mother     Social History Social History   Tobacco Use  . Smoking status: Current Every Day Smoker    Packs/day: 1.00    Years: 30.00    Pack years: 30.00    Types: Cigarettes  . Smokeless tobacco: Former Systems developer  . Tobacco comment: one pack daily  Substance Use Topics  . Alcohol use: No    Alcohol/week: 0.0 standard drinks  . Drug use: Yes    Types: Marijuana    Comment: occasional use    No Known Allergies  No current outpatient medications on file.   No current facility-administered medications for this visit.      Physical Exam  Blood pressure (!) 136/94, pulse 69, temperature (!) 97.5 F (36.4 C), height 5\' 8"  (1.727 m), weight 178 lb (80.7 kg).  Constitutional: overall normal hygiene, normal nutrition, well developed, normal grooming, normal body habitus. Assistive device:none  Musculoskeletal: gait and station Limp none, muscle tone and strength are normal, no tremors or atrophy is present.  .  Neurological: coordination overall normal.  Deep tendon reflex/nerve stretch intact.  Sensation normal.  Cranial nerves II-XII intact.   Skin:    Normal overall no scars, lesions, ulcers or rashes. No psoriasis.  Psychiatric: Alert and oriented x 3.  Recent memory intact, remote memory unclear.  Normal mood and affect. Well groomed.  Good eye contact.  Cardiovascular: overall no swelling, no varicosities, no edema bilaterally, normal temperatures of the legs and arms, no clubbing, cyanosis and good capillary refill.  He has positive Phalens bilaterally, negative Tinels.  He has decreased sensation in median nerve distribution.  Spine/Pelvis examination:  Inspection:  Overall, sacoiliac joint benign and hips nontender; without crepitus or defects.   Thoracic spine inspection: Alignment normal without kyphosis present   Lumbar spine inspection:  Alignment  with normal lumbar lordosis, without scoliosis apparent.   Thoracic spine palpation:  without tenderness of spinal processes   Lumbar spine palpation: without tenderness of lumbar area; without tightness of lumbar muscles    Range of Motion:   Lumbar flexion, forward flexion is normal without pain or tenderness    Lumbar extension is full without pain or tenderness   Left lateral bend is normal without pain or tenderness   Right lateral bend is normal without pain or tenderness   Straight leg raising is normal  Strength & tone: normal   Stability overall normal stability  Lymphatic: palpation is normal.  All other systems reviewed and are negative   The patient has been educated about the nature of the problem(s) and counseled on treatment options.  The patient appeared to understand what I have discussed and is in agreement with it.  Encounter Diagnoses  Name Primary?  . Carpal tunnel syndrome, left upper limb Yes  . Acute midline low back pain without sciatica   . Nicotine dependence, cigarettes, uncomplicated    Lumbar spine X-rays were done, reported separately.  PLAN Call if any problems.  Precautions discussed.  Continue current medications. I will call in  prednisone, Zanaflex and pain medicine.  Return to clinic after EMGs.   Electronically Signed Sanjuana Kava, MD 7/14/20208:49 AM

## 2019-04-23 NOTE — Patient Instructions (Addendum)

## 2019-05-10 ENCOUNTER — Encounter: Payer: Federal, State, Local not specified - PPO | Admitting: Physical Medicine and Rehabilitation

## 2019-05-10 ENCOUNTER — Telehealth: Payer: Self-pay | Admitting: Physical Medicine and Rehabilitation

## 2019-05-10 NOTE — Telephone Encounter (Signed)
Patient called to cancel this appointment.  He would like to r/s.  CB#[phone].  Thank you.

## 2019-05-10 NOTE — Telephone Encounter (Signed)
Called patient to reschedule. No answer and no voicemail.

## 2020-12-19 ENCOUNTER — Encounter (HOSPITAL_COMMUNITY): Payer: Self-pay | Admitting: Emergency Medicine

## 2020-12-19 ENCOUNTER — Other Ambulatory Visit: Payer: Self-pay

## 2020-12-19 ENCOUNTER — Emergency Department (HOSPITAL_COMMUNITY): Payer: Federal, State, Local not specified - PPO

## 2020-12-19 ENCOUNTER — Emergency Department (HOSPITAL_COMMUNITY)
Admission: EM | Admit: 2020-12-19 | Discharge: 2020-12-19 | Disposition: A | Discharge status: Home / Self Care | Payer: Federal, State, Local not specified - PPO | Attending: Emergency Medicine | Admitting: Emergency Medicine

## 2020-12-19 DIAGNOSIS — R079 Chest pain, unspecified: Secondary | ICD-10-CM | POA: Diagnosis present

## 2020-12-19 DIAGNOSIS — R0789 Other chest pain: Secondary | ICD-10-CM | POA: Diagnosis not present

## 2020-12-19 DIAGNOSIS — R109 Unspecified abdominal pain: Secondary | ICD-10-CM | POA: Insufficient documentation

## 2020-12-19 DIAGNOSIS — F17210 Nicotine dependence, cigarettes, uncomplicated: Secondary | ICD-10-CM | POA: Insufficient documentation

## 2020-12-19 MED ORDER — DICLOFENAC SODIUM 75 MG PO TBEC: 75.0000 mg | DELAYED_RELEASE_TABLET | Freq: Two times a day (BID) | ORAL | 0 refills | Status: AC

## 2020-12-19 NOTE — ED Provider Notes (Signed)
[name] EMERGENCY DEPARTMENT Provider Note   CSN: [id number] Arrival date & time: 12/19/20  2671     History Chief Complaint  Patient presents with  . Flank Pain    CHIEF WALKUP is a 45 y.o. male.  The history is provided by the patient. No language interpreter was used.  Flank Pain This is a new problem. The current episode started less than 1 hour ago. The problem occurs constantly. The problem has been gradually worsening. Nothing aggravates the symptoms. Nothing relieves the symptoms. He has tried nothing for the symptoms. The treatment provided no relief.   Pt complains of pain in his right lateral ribs.  Pt reports he has pain with coughing and moving  Pt denies fever or chills      Past Medical History:  Diagnosis Date  . Barrett's esophagus   . Bone spur   . Bursitis of elbow    Right  . GERD (gastroesophageal reflux disease)   . H/O Clostridium difficile infection   . Hemorrhoids   . Hiatal hernia     Patient Active Problem List   Diagnosis Date Noted  . SOB (shortness of breath) 02/16/2017  . Chest discomfort 02/16/2017  . Screening for hyperlipidemia 02/16/2017  . LLQ pain 09/15/2016  . Anorectal fissure 09/15/2016  . History of colonic polyps   . RUQ pain 01/29/2016  . Diarrhea 11/24/2015  . Abdominal pain 07/28/2015  . Loose stools 07/28/2015  . Reflux esophagitis   . Rectal bleeding 04/25/2013  . Barrett's esophagus 04/25/2013  . HEMATEMESIS 12/21/2010  . EPIGASTRIC PAIN 12/21/2010    Past Surgical History:  Procedure Laterality Date  . COLONOSCOPY    07/09/2007   Friable anal canal, occult fissure not excluded  Otherwise, normal rectum, colon, and terminal ileum  . COLONOSCOPY N/A 02/04/2016   Dr. Gala Romney: tubular adenomas X2, internal hemorrhoids. GI pathogen panel negative Cdiff, +rotavirus. Next colonoscopy in 5 years.   . COLONOSCOPY WITH ESOPHAGOGASTRODUODENOSCOPY (EGD) N/A 04/26/2013   Dr. Gala Romney: colonoscopy with internal hemorrhoids  otherwise normal. EGD with erosive reflux esophagitis, +Barrett's. Due for Barrett' surveillance 2015.   Marland Kitchen ESOPHAGOGASTRODUODENOSCOPY  12/23/2010   RMR: short-segment Barrett's  . ESOPHAGOGASTRODUODENOSCOPY N/A 11/28/2014   Dr.Rourk- Erosive reflux esophagitis. Abnormal distal esophagus consistent with short segment Barrett's esophagus-status post biopsy, hiatal hernia.bx= barretts esophagus.   Marland Kitchen HAND SURGERY    . HEMORRHOID BANDING  2016   Dr.Rourk  . HEMORRHOID SURGERY    . SHOULDER SURGERY    . thumb surgery         Family History  Problem Relation Age of Onset  . Colon cancer Father 41  . Heart disease Father 68       Stents.    . Heart murmur Father   . Diabetes Father   . Post-traumatic stress disorder Father   . Colon polyps Other        sister, paternal grandfather  . COPD Mother   . Emphysema Mother     Social History   Tobacco Use  . Smoking status: Current Every Day Smoker    Packs/day: 1.00    Years: 30.00    Pack years: 30.00    Types: Cigarettes  . Smokeless tobacco: Former Systems developer  . Tobacco comment: one pack daily  Substance Use Topics  . Alcohol use: No    Alcohol/week: 0.0 standard drinks  . Drug use: Yes    Types: Marijuana    Comment: occasional use    Home Medications  Prior to Admission medications   Medication Sig Start Date End Date Taking? Authorizing Provider  diclofenac (VOLTAREN) 75 MG EC tablet Take 1 tablet (75 mg total) by mouth 2 (two) times daily. 12/19/20  Yes Fransico Meadow, PA-C    Allergies    Patient has no known allergies.  Review of Systems   Review of Systems  Genitourinary: Positive for flank pain.  All other systems reviewed and are negative.   Physical Exam Updated Vital Signs BP 128/85   Pulse 63   Temp 98.1 F (36.7 C) (Oral)   Resp 16   Ht 5\' 8"  (1.727 m)   Wt 81.6 kg   SpO2 97%   BMI 27.37 kg/m   Physical Exam Vitals and nursing note reviewed.  Constitutional:      Appearance: He is  well-developed.  HENT:     Head: Normocephalic and atraumatic.  Eyes:     Conjunctiva/sclera: Conjunctivae normal.  Cardiovascular:     Rate and Rhythm: Normal rate and regular rhythm.     Heart sounds: No murmur heard.   Pulmonary:     Effort: Pulmonary effort is normal. No respiratory distress.     Breath sounds: Normal breath sounds.  Abdominal:     Palpations: Abdomen is soft.     Tenderness: There is no abdominal tenderness.  Musculoskeletal:     Cervical back: Neck supple.  Skin:    General: Skin is warm and dry.  Neurological:     General: No focal deficit present.     Mental Status: He is alert.  Psychiatric:        Mood and Affect: Mood normal.     ED Results / Procedures / Treatments   Labs (all labs ordered are listed, but only abnormal results are displayed) Labs Reviewed - No data to display  EKG None  Radiology DG Ribs Unilateral W/Chest Right  Result Date: 12/19/2020 CLINICAL DATA:  Upper right flank pain for 5 days. EXAM: RIGHT RIBS AND CHEST - 3+ VIEW COMPARISON:  Feb 13, 2017 FINDINGS: No fracture or other bone lesions are seen involving the ribs. There is no evidence of pneumothorax or pleural effusion. Both lungs are clear. Heart size and mediastinal contours are within normal limits. IMPRESSION: Negative. Electronically Signed   By: Dorise Bullion III M.D   On: 12/19/2020 09:55    Procedures Procedures   Medications Ordered in ED Medications - No data to display  ED Course  I have reviewed the triage vital signs and the nursing notes.  Pertinent labs & imaging results that were available during my care of the patient were reviewed by me and considered in my medical decision making (see chart for details).    MDM Rules/Calculators/A&P                          MDM:  Ribs and chest no fracture no abnormality.  Pt is Perc negative,  Abdomen is nontender,  No flank pain  No urinary symptoms.  I will try pt on rx for voltaren.  Pt advised to  recheck with primary care MD.  Final Clinical Impression(s) / ED Diagnoses Final diagnoses:  Chest Gean Larose pain    Rx / DC Orders ED Discharge Orders         Ordered    diclofenac (VOLTAREN) 75 MG EC tablet  2 times daily        12/19/20 1012  An After Visit Summary was printed and given to the patient.   Fransico Meadow, PA-C 12/19/20 1759    Lorelle Gibbs, DO 12/20/20 0720

## 2020-12-19 NOTE — ED Triage Notes (Signed)
Pt c/o of upper right flank pain x 5 days. Worsening pain with movement or coughing. Denies any urinary s/s

## 2020-12-19 NOTE — Discharge Instructions (Signed)
Return if any problems.

## 2021-01-06 ENCOUNTER — Telehealth: Payer: Self-pay | Admitting: Internal Medicine

## 2021-01-06 NOTE — Telephone Encounter (Signed)
Pt has established with [facility].

## 2021-01-06 NOTE — Telephone Encounter (Signed)
Nurse or office

## 2021-07-27 ENCOUNTER — Encounter: Payer: Self-pay | Admitting: Internal Medicine

## 2021-12-14 ENCOUNTER — Telehealth: Payer: Self-pay | Admitting: Gastroenterology

## 2021-12-14 NOTE — Telephone Encounter (Signed)
Noted  

## 2021-12-14 NOTE — Telephone Encounter (Signed)
Patient is established at [facility], last seen 07/2021 by them.  ? ?He does not need OV here as well. He has went between use and [facility] several times now.  ? ?Please cancel appointment here.  ? ?

## 2021-12-15 ENCOUNTER — Ambulatory Visit: Payer: Federal, State, Local not specified - PPO | Admitting: Gastroenterology
